# Patient Record
Sex: Female | Born: 1940 | Race: White | Hispanic: No | Smoking: Never smoker
Health system: Southern US, Community
[De-identification: ages and names within clinical notes are randomized; demographics above are authoritative.]

## PROBLEM LIST (undated history)

## (undated) DIAGNOSIS — D649 Anemia, unspecified: Secondary | ICD-10-CM

## (undated) DIAGNOSIS — I10 Essential (primary) hypertension: Secondary | ICD-10-CM

## (undated) DIAGNOSIS — E7800 Pure hypercholesterolemia, unspecified: Secondary | ICD-10-CM

## (undated) DIAGNOSIS — Z9889 Other specified postprocedural states: Secondary | ICD-10-CM

## (undated) DIAGNOSIS — R51 Headache: Secondary | ICD-10-CM

## (undated) DIAGNOSIS — R112 Nausea with vomiting, unspecified: Secondary | ICD-10-CM

## (undated) DIAGNOSIS — M1990 Unspecified osteoarthritis, unspecified site: Secondary | ICD-10-CM

## (undated) DIAGNOSIS — R519 Headache, unspecified: Secondary | ICD-10-CM

## (undated) DIAGNOSIS — D333 Benign neoplasm of cranial nerves: Secondary | ICD-10-CM

## (undated) HISTORY — PX: CATARACT EXTRACTION: SUR2

## (undated) HISTORY — PX: PLACEMENT OF BREAST IMPLANTS: SHX6334

## (undated) HISTORY — PX: ABDOMINAL HYSTERECTOMY: SHX81

---
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
## 2004-10-20 ENCOUNTER — Ambulatory Visit: Payer: Self-pay | Admitting: Gynecologic Oncology

## 2004-11-06 ENCOUNTER — Ambulatory Visit: Payer: Self-pay | Admitting: Gynecologic Oncology

## 2004-12-24 ENCOUNTER — Ambulatory Visit: Payer: Self-pay | Admitting: Unknown Physician Specialty

## 2004-12-24 ENCOUNTER — Other Ambulatory Visit: Payer: Self-pay

## 2004-12-29 ENCOUNTER — Ambulatory Visit: Payer: Self-pay | Admitting: Unknown Physician Specialty

## 2005-02-09 ENCOUNTER — Ambulatory Visit: Payer: Self-pay | Admitting: Oncology

## 2005-03-10 ENCOUNTER — Ambulatory Visit: Payer: Self-pay | Admitting: Internal Medicine

## 2006-04-03 ENCOUNTER — Ambulatory Visit: Payer: Self-pay | Admitting: Internal Medicine

## 2006-04-26 ENCOUNTER — Ambulatory Visit: Payer: Self-pay | Admitting: Gastroenterology

## 2007-07-27 ENCOUNTER — Ambulatory Visit: Payer: Self-pay | Admitting: Family Medicine

## 2008-07-29 ENCOUNTER — Ambulatory Visit: Payer: Self-pay | Admitting: Family Medicine

## 2009-08-24 ENCOUNTER — Ambulatory Visit: Payer: Self-pay | Admitting: Family Medicine

## 2010-08-11 ENCOUNTER — Ambulatory Visit: Payer: Self-pay | Admitting: General Practice

## 2010-08-13 HISTORY — PX: OTHER SURGICAL HISTORY: SHX169

## 2010-08-23 ENCOUNTER — Inpatient Hospital Stay: Payer: Self-pay | Admitting: General Practice

## 2010-09-27 ENCOUNTER — Ambulatory Visit: Payer: Self-pay | Admitting: Family Medicine

## 2010-10-12 ENCOUNTER — Ambulatory Visit: Payer: Self-pay | Admitting: Family Medicine

## 2010-11-02 ENCOUNTER — Ambulatory Visit: Payer: Self-pay | Admitting: Family Medicine

## 2011-02-14 ENCOUNTER — Ambulatory Visit: Payer: Self-pay | Admitting: Unknown Physician Specialty

## 2011-02-15 ENCOUNTER — Ambulatory Visit: Payer: Self-pay | Admitting: Unknown Physician Specialty

## 2011-06-22 ENCOUNTER — Ambulatory Visit: Payer: Self-pay | Admitting: Otolaryngology

## 2011-08-09 DIAGNOSIS — D333 Benign neoplasm of cranial nerves: Secondary | ICD-10-CM | POA: Diagnosis present

## 2012-04-17 ENCOUNTER — Ambulatory Visit: Payer: Self-pay | Admitting: Internal Medicine

## 2012-05-03 ENCOUNTER — Ambulatory Visit: Payer: Self-pay | Admitting: Orthopedic Surgery

## 2012-08-17 ENCOUNTER — Ambulatory Visit: Payer: Self-pay | Admitting: Internal Medicine

## 2012-08-17 LAB — COMPREHENSIVE METABOLIC PANEL
Albumin: 4 g/dL (ref 3.4–5.0)
Alkaline Phosphatase: 91 U/L (ref 50–136)
Anion Gap: 8 (ref 7–16)
BUN: 19 mg/dL — ABNORMAL HIGH (ref 7–18)
Chloride: 106 mmol/L (ref 98–107)
Co2: 27 mmol/L (ref 21–32)
Creatinine: 0.95 mg/dL (ref 0.60–1.30)
EGFR (African American): >60
Glucose: 95 mg/dL (ref 65–99)
Osmolality: 283 (ref 275–301)

## 2012-08-17 LAB — CBC WITH DIFFERENTIAL/PLATELET
Basophil %: 0.6 %
Eosinophil #: 0.3 10*3/uL (ref 0.0–0.7)
Eosinophil %: 7.2 %
HCT: 36.9 % (ref 35.0–47.0)
Lymphocyte #: 0.8 10*3/uL — ABNORMAL LOW (ref 1.0–3.6)
Lymphocyte %: 17.6 %
MCV: 96 fL (ref 80–100)
Neutrophil #: 2.8 10*3/uL (ref 1.4–6.5)
RDW: 15.2 % — ABNORMAL HIGH (ref 11.5–14.5)

## 2012-08-17 LAB — LIPID PANEL
Cholesterol: 216 mg/dL — ABNORMAL HIGH (ref 0–200)
HDL Cholesterol: 83 mg/dL — ABNORMAL HIGH (ref 40–60)

## 2012-08-17 LAB — FERRITIN: Ferritin (ARMC): 78 ng/mL (ref 8–388)

## 2012-09-12 ENCOUNTER — Ambulatory Visit: Payer: Self-pay | Admitting: Otolaryngology

## 2012-10-17 ENCOUNTER — Ambulatory Visit: Payer: Self-pay | Admitting: Internal Medicine

## 2012-10-18 ENCOUNTER — Ambulatory Visit: Payer: Self-pay | Admitting: Internal Medicine

## 2012-10-23 ENCOUNTER — Other Ambulatory Visit: Payer: Self-pay | Admitting: Internal Medicine

## 2012-10-23 DIAGNOSIS — N63 Unspecified lump in unspecified breast: Secondary | ICD-10-CM

## 2012-10-30 ENCOUNTER — Ambulatory Visit
Admission: RE | Admit: 2012-10-30 | Discharge: 2012-10-30 | Disposition: A | Discharge status: Home / Self Care | Payer: Medicare Other | Source: Ambulatory Visit | Attending: Internal Medicine | Admitting: Internal Medicine

## 2012-10-30 DIAGNOSIS — N63 Unspecified lump in unspecified breast: Secondary | ICD-10-CM

## 2012-10-30 MED ORDER — GADOBENATE DIMEGLUMINE 529 MG/ML IV SOLN
15.0000 mL | Freq: Once | INTRAVENOUS | Status: AC | PRN
  Administered 2012-10-30: 15 mL via INTRAVENOUS

## 2013-08-08 HISTORY — PX: FRACTURE SURGERY: SHX138

## 2013-09-03 ENCOUNTER — Ambulatory Visit: Payer: Self-pay | Admitting: Otolaryngology

## 2014-05-11 ENCOUNTER — Emergency Department: Payer: Self-pay | Admitting: Emergency Medicine

## 2014-05-17 ENCOUNTER — Emergency Department: Payer: Self-pay | Admitting: Internal Medicine

## 2014-10-01 ENCOUNTER — Ambulatory Visit: Payer: Self-pay | Admitting: Otolaryngology

## 2014-12-03 ENCOUNTER — Ambulatory Visit
Admit: 2014-12-03 | Disposition: A | Discharge status: Home / Self Care | Payer: Self-pay | Attending: Ophthalmology | Admitting: Ophthalmology

## 2015-11-27 ENCOUNTER — Other Ambulatory Visit: Payer: Self-pay | Admitting: Otolaryngology

## 2015-11-27 DIAGNOSIS — D333 Benign neoplasm of cranial nerves: Secondary | ICD-10-CM

## 2015-12-30 ENCOUNTER — Ambulatory Visit
Admission: RE | Admit: 2015-12-30 | Discharge: 2015-12-30 | Disposition: A | Discharge status: Home / Self Care | Payer: Medicare HMO | Source: Ambulatory Visit | Attending: Otolaryngology | Admitting: Otolaryngology

## 2015-12-30 DIAGNOSIS — I6782 Cerebral ischemia: Secondary | ICD-10-CM | POA: Diagnosis not present

## 2015-12-30 DIAGNOSIS — D333 Benign neoplasm of cranial nerves: Secondary | ICD-10-CM | POA: Diagnosis present

## 2015-12-30 LAB — POCT I-STAT CREATININE: Creatinine, Ser: 1.2 mg/dL — ABNORMAL HIGH (ref 0.44–1.00)

## 2015-12-30 MED ORDER — GADOBENATE DIMEGLUMINE 529 MG/ML IV SOLN
15.0000 mL | Freq: Once | INTRAVENOUS | Status: AC | PRN
  Administered 2015-12-30: 15 mL via INTRAVENOUS

## 2016-04-04 ENCOUNTER — Other Ambulatory Visit: Payer: Self-pay | Admitting: Family Medicine

## 2016-04-04 DIAGNOSIS — Z780 Asymptomatic menopausal state: Secondary | ICD-10-CM

## 2016-10-31 ENCOUNTER — Other Ambulatory Visit: Payer: Self-pay | Admitting: Orthopedic Surgery

## 2016-10-31 DIAGNOSIS — G8929 Other chronic pain: Secondary | ICD-10-CM

## 2016-10-31 DIAGNOSIS — M5441 Lumbago with sciatica, right side: Principal | ICD-10-CM

## 2016-11-09 ENCOUNTER — Ambulatory Visit
Admission: RE | Admit: 2016-11-09 | Discharge: 2016-11-09 | Disposition: A | Discharge status: Home / Self Care | Payer: Medicare HMO | Source: Ambulatory Visit | Attending: Orthopedic Surgery | Admitting: Orthopedic Surgery

## 2016-11-15 ENCOUNTER — Ambulatory Visit: Payer: Medicare HMO

## 2017-01-13 ENCOUNTER — Encounter
Admission: RE | Admit: 2017-01-13 | Discharge: 2017-01-13 | Disposition: A | Discharge status: Home / Self Care | Payer: Medicare HMO | Source: Ambulatory Visit | Attending: Orthopedic Surgery | Admitting: Orthopedic Surgery

## 2017-01-13 DIAGNOSIS — Z01818 Encounter for other preprocedural examination: Secondary | ICD-10-CM | POA: Insufficient documentation

## 2017-01-13 DIAGNOSIS — I10 Essential (primary) hypertension: Secondary | ICD-10-CM | POA: Insufficient documentation

## 2017-01-13 DIAGNOSIS — R9431 Abnormal electrocardiogram [ECG] [EKG]: Secondary | ICD-10-CM | POA: Diagnosis not present

## 2017-01-13 DIAGNOSIS — M1611 Unilateral primary osteoarthritis, right hip: Secondary | ICD-10-CM | POA: Insufficient documentation

## 2017-01-13 HISTORY — DX: Nausea with vomiting, unspecified: R11.2

## 2017-01-13 HISTORY — DX: Headache, unspecified: R51.9

## 2017-01-13 HISTORY — DX: Unspecified osteoarthritis, unspecified site: M19.90

## 2017-01-13 HISTORY — DX: Headache: R51

## 2017-01-13 HISTORY — DX: Essential (primary) hypertension: I10

## 2017-01-13 HISTORY — DX: Anemia, unspecified: D64.9

## 2017-01-13 HISTORY — DX: Pure hypercholesterolemia, unspecified: E78.00

## 2017-01-13 HISTORY — DX: Other specified postprocedural states: Z98.890

## 2017-01-13 LAB — TYPE AND SCREEN
ABO/RH(D): O POS
Antibody Screen: NEGATIVE

## 2017-01-13 LAB — COMPREHENSIVE METABOLIC PANEL
ALK PHOS: 68 U/L (ref 38–126)
ALT: 18 U/L (ref 14–54)
AST: 28 U/L (ref 15–41)
Albumin: 4.2 g/dL (ref 3.5–5.0)
Anion gap: 10 (ref 5–15)
BILIRUBIN TOTAL: 0.8 mg/dL (ref 0.3–1.2)
BUN: 23 mg/dL — AB (ref 6–20)
CALCIUM: 9.6 mg/dL (ref 8.9–10.3)
CO2: 24 mmol/L (ref 22–32)
CREATININE: 1.14 mg/dL — AB (ref 0.44–1.00)
Chloride: 101 mmol/L (ref 101–111)
GFR, EST AFRICAN AMERICAN: 52 mL/min — AB (ref >60)
GFR, EST NON AFRICAN AMERICAN: 45 mL/min — AB (ref >60)
Glucose, Bld: 97 mg/dL (ref 65–99)
Potassium: 4 mmol/L (ref 3.5–5.1)
Sodium: 135 mmol/L (ref 135–145)
TOTAL PROTEIN: 7.2 g/dL (ref 6.5–8.1)

## 2017-01-13 LAB — URINALYSIS, ROUTINE W REFLEX MICROSCOPIC
BILIRUBIN URINE: NEGATIVE
GLUCOSE, UA: NEGATIVE mg/dL
HGB URINE DIPSTICK: NEGATIVE
Ketones, ur: NEGATIVE mg/dL
NITRITE: NEGATIVE
PROTEIN: NEGATIVE mg/dL
Specific Gravity, Urine: 1.009 (ref 1.005–1.030)
pH: 5 (ref 5.0–8.0)

## 2017-01-13 LAB — PROTIME-INR
INR: 0.99
PROTHROMBIN TIME: 13.1 s (ref 11.4–15.2)

## 2017-01-13 LAB — SEDIMENTATION RATE: SED RATE: 35 mm/h — AB (ref 0–30)

## 2017-01-13 LAB — CBC
HEMATOCRIT: 36.1 % (ref 35.0–47.0)
Hemoglobin: 12.2 g/dL (ref 12.0–16.0)
MCH: 33.2 pg (ref 26.0–34.0)
MCHC: 33.7 g/dL (ref 32.0–36.0)
MCV: 98.5 fL (ref 80.0–100.0)
PLATELETS: 220 10*3/uL (ref 150–440)
RBC: 3.67 MIL/uL — AB (ref 3.80–5.20)
RDW: 14.8 % — ABNORMAL HIGH (ref 11.5–14.5)
WBC: 5.2 10*3/uL (ref 3.6–11.0)

## 2017-01-13 LAB — SURGICAL PCR SCREEN
MRSA, PCR: NEGATIVE
Staphylococcus aureus: NEGATIVE

## 2017-01-13 LAB — APTT: aPTT: 28 seconds (ref 24–36)

## 2017-01-13 LAB — C-REACTIVE PROTEIN: CRP: <0.8 mg/dL (ref <1.0)

## 2017-01-13 NOTE — Patient Instructions (Addendum)
  Your procedure is scheduled BB:[id number] January 27, 2017. Report to Same Day Surgery. To find out your arrival time please call [phone] between 1PM - 3PM on Thursday January 26, 2017.  Remember: Instructions that are not followed completely may result in serious medical risk, up to and including death, or upon the discretion of your surgeon and anesthesiologist your surgery may need to be rescheduled.    _x___ 1. Do not eat food or drink liquids after midnight. No gum chewing or hard candies.     _x___ 2. No Alcohol for 24 hours before or after surgery.   ____ 3. Bring all medications with you on the day of surgery if instructed.    __x__ 4. Notify your doctor if there is any change in your medical condition     (cold, fever, infections).    _____ 5. No smoking 24 hours prior to surgery.     Do not wear jewelry, make-up, hairpins, clips or nail polish.  Do not wear lotions, powders, or perfumes.   Do not shave 48 hours prior to surgery. Men may shave face and neck.  Do not bring valuables to the hospital.    [facility] is not responsible for any belongings or valuables.               Contacts, dentures or bridgework may not be worn into surgery.  Leave your suitcase in the car. After surgery it may be brought to your room.  For patients admitted to the hospital, discharge time is determined by your treatment team.   Patients discharged the day of surgery will not be allowed to drive home.    Please read over the following fact sheets that you were given:   [facility] Preparing for Surgery  __x__ Take these medicines the morning of surgery with A SIP OF WATER:    1. Omperazole/ Prilosec   ____ [employer] (as directed)   _x___ Use CHG Soap as directed on instruction sheet  ____ Use inhalers on the day of surgery and bring to hospital day of surgery  ____ Stop metformin 2 days prior to surgery    ____ Take 1/2 of usual insulin dose the night before surgery and none on  the morning of surgery.   __x__ Stop aspirin on 5 days prior to surgery.  _x___ Stop Anti-inflammatories such as Advil, Aleve, Ibuprofen, Motrin, Naproxen, Naprosyn, Goodies powders or aspirin products. OK to take Tylenol.   _x___ Stop supplements: Physio Omega and Anti-aging total body defense until after surgery.    ____ Bring C-Pap to the hospital.

## 2017-01-16 LAB — URINE CULTURE: Special Requests: NORMAL

## 2017-01-16 NOTE — Pre-Procedure Instructions (Signed)
UA and urine culture sent to Dr. Marry Guan for review.

## 2017-01-23 ENCOUNTER — Inpatient Hospital Stay
Admission: EM | Admit: 2017-01-23 | Discharge: 2017-01-29 | DRG: 470 | Disposition: A | Discharge status: Home Health | Payer: Medicare HMO | Attending: Orthopedic Surgery | Admitting: Orthopedic Surgery

## 2017-01-23 ENCOUNTER — Encounter: Payer: Self-pay | Admitting: *Deleted

## 2017-01-23 ENCOUNTER — Emergency Department: Payer: Medicare HMO

## 2017-01-23 ENCOUNTER — Inpatient Hospital Stay: Payer: Medicare HMO

## 2017-01-23 DIAGNOSIS — M79659 Pain in unspecified thigh: Secondary | ICD-10-CM

## 2017-01-23 DIAGNOSIS — M48061 Spinal stenosis, lumbar region without neurogenic claudication: Secondary | ICD-10-CM | POA: Diagnosis present

## 2017-01-23 DIAGNOSIS — Z96649 Presence of unspecified artificial hip joint: Secondary | ICD-10-CM

## 2017-01-23 DIAGNOSIS — Z9849 Cataract extraction status, unspecified eye: Secondary | ICD-10-CM

## 2017-01-23 DIAGNOSIS — M792 Neuralgia and neuritis, unspecified: Secondary | ICD-10-CM

## 2017-01-23 DIAGNOSIS — E7800 Pure hypercholesterolemia, unspecified: Secondary | ICD-10-CM | POA: Diagnosis present

## 2017-01-23 DIAGNOSIS — Z9882 Breast implant status: Secondary | ICD-10-CM

## 2017-01-23 DIAGNOSIS — I739 Peripheral vascular disease, unspecified: Secondary | ICD-10-CM | POA: Diagnosis present

## 2017-01-23 DIAGNOSIS — M1611 Unilateral primary osteoarthritis, right hip: Principal | ICD-10-CM | POA: Diagnosis present

## 2017-01-23 DIAGNOSIS — M25551 Pain in right hip: Secondary | ICD-10-CM | POA: Diagnosis not present

## 2017-01-23 DIAGNOSIS — E785 Hyperlipidemia, unspecified: Secondary | ICD-10-CM | POA: Diagnosis present

## 2017-01-23 DIAGNOSIS — M879 Osteonecrosis, unspecified: Secondary | ICD-10-CM | POA: Diagnosis present

## 2017-01-23 DIAGNOSIS — K5900 Constipation, unspecified: Secondary | ICD-10-CM | POA: Diagnosis present

## 2017-01-23 DIAGNOSIS — M4726 Other spondylosis with radiculopathy, lumbar region: Secondary | ICD-10-CM | POA: Diagnosis present

## 2017-01-23 DIAGNOSIS — Z79899 Other long term (current) drug therapy: Secondary | ICD-10-CM

## 2017-01-23 DIAGNOSIS — M5410 Radiculopathy, site unspecified: Secondary | ICD-10-CM

## 2017-01-23 DIAGNOSIS — M5116 Intervertebral disc disorders with radiculopathy, lumbar region: Secondary | ICD-10-CM | POA: Diagnosis present

## 2017-01-23 DIAGNOSIS — K219 Gastro-esophageal reflux disease without esophagitis: Secondary | ICD-10-CM | POA: Diagnosis present

## 2017-01-23 DIAGNOSIS — I10 Essential (primary) hypertension: Secondary | ICD-10-CM | POA: Diagnosis present

## 2017-01-23 DIAGNOSIS — Z96651 Presence of right artificial knee joint: Secondary | ICD-10-CM | POA: Diagnosis present

## 2017-01-23 DIAGNOSIS — G8929 Other chronic pain: Secondary | ICD-10-CM | POA: Diagnosis present

## 2017-01-23 DIAGNOSIS — M79604 Pain in right leg: Secondary | ICD-10-CM

## 2017-01-23 DIAGNOSIS — R531 Weakness: Secondary | ICD-10-CM | POA: Diagnosis present

## 2017-01-23 DIAGNOSIS — Z90710 Acquired absence of both cervix and uterus: Secondary | ICD-10-CM

## 2017-01-23 DIAGNOSIS — D333 Benign neoplasm of cranial nerves: Secondary | ICD-10-CM | POA: Diagnosis present

## 2017-01-23 DIAGNOSIS — Z7982 Long term (current) use of aspirin: Secondary | ICD-10-CM

## 2017-01-23 HISTORY — DX: Benign neoplasm of cranial nerves: D33.3

## 2017-01-23 LAB — CBC
HCT: 36.9 % (ref 35.0–47.0)
HEMOGLOBIN: 12.7 g/dL (ref 12.0–16.0)
MCH: 33.6 pg (ref 26.0–34.0)
MCHC: 34.5 g/dL (ref 32.0–36.0)
MCV: 97.4 fL (ref 80.0–100.0)
PLATELETS: 223 10*3/uL (ref 150–440)
RBC: 3.79 MIL/uL — ABNORMAL LOW (ref 3.80–5.20)
RDW: 14.8 % — AB (ref 11.5–14.5)
WBC: 10.3 10*3/uL (ref 3.6–11.0)

## 2017-01-23 LAB — BASIC METABOLIC PANEL
Anion gap: 8 (ref 5–15)
BUN: 16 mg/dL (ref 6–20)
CALCIUM: 9.6 mg/dL (ref 8.9–10.3)
CHLORIDE: 104 mmol/L (ref 101–111)
CO2: 23 mmol/L (ref 22–32)
CREATININE: 1.02 mg/dL — AB (ref 0.44–1.00)
GFR calc non Af Amer: 52 mL/min — ABNORMAL LOW (ref >60)
GFR, EST AFRICAN AMERICAN: 60 mL/min — AB (ref >60)
Glucose, Bld: 116 mg/dL — ABNORMAL HIGH (ref 65–99)
Potassium: 3.5 mmol/L (ref 3.5–5.1)
SODIUM: 135 mmol/L (ref 135–145)

## 2017-01-23 MED ORDER — ACETAMINOPHEN ER 650 MG PO TBCR: 1300.0000 mg | EXTENDED_RELEASE_TABLET | Freq: Three times a day (TID) | ORAL | Status: DC

## 2017-01-23 MED ORDER — FENTANYL CITRATE (PF) 100 MCG/2ML IJ SOLN
50.0000 ug | Freq: Once | INTRAMUSCULAR | Status: AC
  Administered 2017-01-23: 50 ug via INTRAVENOUS
  Filled 2017-01-23: qty 2

## 2017-01-23 MED ORDER — OXYCODONE HCL 5 MG PO TABS: 10.0000 mg | ORAL_TABLET | ORAL | Status: DC | PRN

## 2017-01-23 MED ORDER — CHLORHEXIDINE GLUCONATE 4 % EX LIQD: 60.0000 mL | Freq: Once | CUTANEOUS | Status: DC

## 2017-01-23 MED ORDER — ADULT MULTIVITAMIN W/MINERALS CH
1.0000 | ORAL_TABLET | Freq: Every day | ORAL | Status: DC
  Administered 2017-01-24 – 2017-01-29 (×5): 1 via ORAL
  Filled 2017-01-23 (×5): qty 1

## 2017-01-23 MED ORDER — CEFAZOLIN SODIUM-DEXTROSE 2-4 GM/100ML-% IV SOLN
2.0000 g | INTRAVENOUS | Status: DC
  Filled 2017-01-23: qty 100

## 2017-01-23 MED ORDER — CALCIUM CARBONATE-VITAMIN D 500-200 MG-UNIT PO TABS
1.0000 | ORAL_TABLET | Freq: Every day | ORAL | Status: DC
  Administered 2017-01-24 – 2017-01-29 (×5): 1 via ORAL
  Filled 2017-01-23 (×5): qty 1

## 2017-01-23 MED ORDER — DOCUSATE SODIUM 100 MG PO CAPS
100.0000 mg | ORAL_CAPSULE | Freq: Two times a day (BID) | ORAL | Status: DC
  Administered 2017-01-23 – 2017-01-26 (×7): 100 mg via ORAL
  Filled 2017-01-23 (×7): qty 1

## 2017-01-23 MED ORDER — HYDROCODONE-ACETAMINOPHEN 5-325 MG PO TABS
1.0000 | ORAL_TABLET | ORAL | Status: DC | PRN
  Administered 2017-01-24 (×2): 1 via ORAL
  Administered 2017-01-24: 2 via ORAL
  Administered 2017-01-25 – 2017-01-26 (×6): 1 via ORAL
  Administered 2017-01-26 – 2017-01-27 (×3): 2 via ORAL
  Filled 2017-01-23 (×3): qty 1
  Filled 2017-01-23: qty 2
  Filled 2017-01-23 (×5): qty 1
  Filled 2017-01-23: qty 2
  Filled 2017-01-23: qty 1
  Filled 2017-01-23: qty 2
  Filled 2017-01-23: qty 1

## 2017-01-23 MED ORDER — LACTATED RINGERS IV SOLN: INTRAVENOUS | Status: DC

## 2017-01-23 MED ORDER — ROSUVASTATIN CALCIUM 10 MG PO TABS
40.0000 mg | ORAL_TABLET | Freq: Every day | ORAL | Status: DC
  Administered 2017-01-23 – 2017-01-26 (×4): 40 mg via ORAL
  Filled 2017-01-23: qty 4
  Filled 2017-01-23: qty 2
  Filled 2017-01-23: qty 4
  Filled 2017-01-23 (×2): qty 2
  Filled 2017-01-23: qty 4

## 2017-01-23 MED ORDER — METOPROLOL SUCCINATE ER 50 MG PO TB24
50.0000 mg | ORAL_TABLET | Freq: Every day | ORAL | Status: DC
  Administered 2017-01-23 – 2017-01-28 (×6): 50 mg via ORAL
  Filled 2017-01-23 (×6): qty 1

## 2017-01-23 MED ORDER — ONDANSETRON HCL 4 MG/2ML IJ SOLN
4.0000 mg | Freq: Four times a day (QID) | INTRAMUSCULAR | Status: DC | PRN
  Administered 2017-01-27: 4 mg via INTRAVENOUS

## 2017-01-23 MED ORDER — ACETAMINOPHEN 650 MG RE SUPP: 650.0000 mg | Freq: Four times a day (QID) | RECTAL | Status: DC | PRN

## 2017-01-23 MED ORDER — ACETAMINOPHEN 325 MG PO TABS: 650.0000 mg | ORAL_TABLET | Freq: Four times a day (QID) | ORAL | Status: DC | PRN

## 2017-01-23 MED ORDER — SENNOSIDES-DOCUSATE SODIUM 8.6-50 MG PO TABS: 1.0000 | ORAL_TABLET | Freq: Every evening | ORAL | Status: DC | PRN

## 2017-01-23 MED ORDER — MORPHINE SULFATE (PF) 2 MG/ML IV SOLN
2.0000 mg | INTRAVENOUS | Status: DC | PRN
  Administered 2017-01-23 – 2017-01-25 (×2): 2 mg via INTRAVENOUS
  Filled 2017-01-23 (×2): qty 1

## 2017-01-23 MED ORDER — PANTOPRAZOLE SODIUM 40 MG PO TBEC
40.0000 mg | DELAYED_RELEASE_TABLET | Freq: Every day | ORAL | Status: DC
Start: 2017-01-24 — End: 2017-01-27
  Administered 2017-01-24 – 2017-01-26 (×3): 40 mg via ORAL
  Filled 2017-01-23 (×3): qty 1

## 2017-01-23 MED ORDER — ONDANSETRON HCL 4 MG PO TABS
4.0000 mg | ORAL_TABLET | Freq: Four times a day (QID) | ORAL | Status: DC | PRN
  Administered 2017-01-23: 4 mg via ORAL
  Filled 2017-01-23: qty 1

## 2017-01-23 MED ORDER — TRANEXAMIC ACID 1000 MG/10ML IV SOLN
1000.0000 mg | INTRAVENOUS | Status: DC
  Filled 2017-01-23: qty 10

## 2017-01-23 MED ORDER — RISAQUAD PO CAPS
1.0000 | ORAL_CAPSULE | Freq: Every day | ORAL | Status: DC
  Administered 2017-01-24 – 2017-01-29 (×5): 1 via ORAL
  Filled 2017-01-23 (×6): qty 1

## 2017-01-23 MED ORDER — ONDANSETRON HCL 4 MG/2ML IJ SOLN
4.0000 mg | Freq: Once | INTRAMUSCULAR | Status: AC
  Administered 2017-01-23: 4 mg via INTRAVENOUS
  Filled 2017-01-23: qty 2

## 2017-01-23 MED ORDER — AZO CRANBERRY 250-30 MG PO TABS: ORAL_TABLET | Freq: Every day | ORAL | Status: DC | PRN

## 2017-01-23 NOTE — ED Notes (Signed)
Pt to xray at this time.

## 2017-01-23 NOTE — Consult Note (Signed)
Orthopaedics:  Patient seen and evaluated. Studies reviewed. Will order lumbar MRI due to the radicular symptoms.  Full note to follow.  James P. Holley Bouche M.D.

## 2017-01-23 NOTE — ED Notes (Signed)
Patient wanting to know about her bed and when she is going to get to her room. Explained to patient this can sometimes take an hour or so.

## 2017-01-23 NOTE — ED Provider Notes (Signed)
Clinical Course as of Jan 23 1933  Mon Jan 23, 2017  1517 Assuming care from Dr. Kerman Passey.  In short, Aimee Oliver is a 76 y.o. female with a chief complaint of hip pain and inability to ambulate.  Refer to the original H&P for additional details.  The current plan of care is to obtain MRI of hip as per Dr. Clydell Hakim request and then update him by phone.  Anticipate admission due to intractable pain and inability to ambulate.   [CF]  1752 Updated Dr. Marry Guan about MRI results, but he is in the OR and I could not speak with him directly.  Messages were relayed between [place] and the two of [place].  He will call me back after he is finished because I have multiple critical patients in the ED at this time.  [CF]    Clinical Course User Index [CF] Hinda Kehr, MD   7:35 PM:  Spoke by phone with Dr. Marry Guan a few minutes ago.  He is out of the operating room.  I updated him about the fact that the patient is now admitted and gave him the room number.  He will go now to see the patient in person and order any additional studies that may be needed.   Hinda Kehr, MD 01/23/17 Aimee Oliver

## 2017-01-23 NOTE — ED Provider Notes (Signed)
[facility] Emergency Department Provider Note  Time seen: 2:10 PM  I have reviewed the triage vital signs and the nursing notes.   HISTORY  Chief Complaint Hip Pain    HPI Aimee Oliver is a 76 y.o. female with a past medical history of anemia, hypertension, hyperlipidemia, presents to the emergency department with right hip pain. According to the patient she has a history of arthritis, is scheduled to have a right hip replacement performed on Friday by Dr. Bess Harvest. However the patient states since yesterday she has had significant increase in pain in the right hip. States she is having significant pain with any attempted ambulation. States she cannot sit for longer and 20-30 minutes before the pain becomes too severe. Patient is only prescribed Tylenol at home for discomfort. States she called Dr. Scarlett Presto office and they referred her to the emergency department for evaluation. Patient denies any other complaints at this time. Denies any falls or trauma.  Past Medical History:  Diagnosis Date  . Anemia    boarderline anemic  . Arthritis   . Headache    sinus head aches  . Hypercholesterolemia   . Hypertension   . PONV (postoperative nausea and vomiting)    with hysterectomy    There are no active problems to display for this patient.   Past Surgical History:  Procedure Laterality Date  . ABDOMINAL HYSTERECTOMY    . FRACTURE SURGERY Left 2015   wrist  . FRACTURE SURGERY Left 1956   ankle  . JOINT REPLACEMENT Right 2012    Prior to Admission medications   Medication Sig Start Date End Date Taking? Authorizing Provider  acetaminophen (TYLENOL) 650 MG CR tablet Take 1,300 mg by mouth 3 (three) times daily.    [provider]  Ascorbic Acid (VITAMIN C PO) Take 1 tablet by mouth daily as needed (for immune system booster).    [provider]  aspirin EC 81 MG tablet Take 81 mg by mouth daily.    [provider]  Calcium  Carbonate-Vit D-Min (CALTRATE 600+D PLUS PO) Take 1 tablet by mouth daily.    [provider]  Cranberry-Vitamin C-Probiotic (AZO CRANBERRY PO) Take 1 tablet by mouth daily as needed (for UTI prevention).    [provider]  Lactobacillus (ACIDOPHILUS PO) Take 1 capsule by mouth daily.    [provider]  metoprolol succinate (TOPROL-XL) 50 MG 24 hr tablet Take 50 mg by mouth at bedtime. 12/06/16   [provider]  Multiple Vitamin (MULTIVITAMIN WITH MINERALS) TABS tablet Take 1 tablet by mouth daily. One-A-Day Women's 50+    [provider]  omeprazole (PRILOSEC) 20 MG capsule Take 20 mg by mouth daily before breakfast. 12/06/16   [provider]  OVER THE COUNTER MEDICATION Take 1 tablet by mouth 2 (two) times daily. PHYSIO OMEGA (a blend of DPA, EPA, & DHA FATTY ACIDS)    [provider]  OVER THE COUNTER MEDICATION 1 tablet at bedtime. Applied nutrition Anti-Aging total body daily defense, Co Q10, Vitamin D3, Resveratrol, ginkgo, green tea, lutein and Omega-3 oils    [provider]  phenylephrine (SUDAFED PE) 10 MG TABS tablet Take 10 mg by mouth every 4 (four) hours as needed.    [provider]  rosuvastatin (CRESTOR) 40 MG tablet Take 40 mg by mouth at bedtime.    [provider]    No Known Allergies  History reviewed. No pertinent family history.  Social History Social  History  Substance Use Topics  . Smoking status: Never Smoker  . Smokeless tobacco: Never Used  . Alcohol use Not on file     Comment: 1 cocktail every night    Review of Systems Constitutional: Negative for fever Cardiovascular: Negative for chest pain. Respiratory: Negative for shortness of breath. Gastrointestinal: Negative for abdominal pain Musculoskeletal: Right hip pain Neurological: Negative for headache All other ROS negative  ____________________________________________   PHYSICAL EXAM:  VITAL SIGNS: ED  Triage Vitals  Enc Vitals Group     BP 01/23/17 1358 (!) 189/89     Pulse Rate 01/23/17 1358 (!) 114     Resp 01/23/17 1358 18     Temp 01/23/17 1358 98.7 F (37.1 C)     Temp Source 01/23/17 1358 Oral     SpO2 01/23/17 1358 97 %     Weight 01/23/17 1358 166 lb (75.3 kg)     Height 01/23/17 1358 5\' 5"  (1.651 m)     Head Circumference --      Peak Flow --      Pain Score 01/23/17 1357 10     Pain Loc --      Pain Edu? --      Excl. in [place]? --     Constitutional: Alert and oriented. Well appearing and in no distress. Eyes: Normal exam ENT   Head: Normocephalic and atraumatic.   Mouth/Throat: Mucous membranes are moist. Cardiovascular: Normal rate, regular rhythm. No murmur Respiratory: Normal respiratory effort without tachypnea nor retractions. Breath sounds are clear  Gastrointestinal: Soft and nontender. No distention Musculoskeletal: Patient with mild tenderness palpation of the right hip, significant pain with attempted range of motion of the right hip. Neurovascularly intact distally. Sensation intact and equal. Neurologic:  Normal speech and language. No gross focal neurologic deficits  Skin:  Skin is warm, dry and intact.  Psychiatric: Mood and affect are normal.     RADIOLOGY  IMPRESSION: 1. Degenerative changes lumbar spine and both hips.  2. Sclerotic changes both femoral heads. Avascular necrosis cannot be excluded.  3. Peripheral vascular disease .  ____________________________________________   INITIAL IMPRESSION / ASSESSMENT AND PLAN / ED COURSE  Pertinent labs & imaging results that were available during my care of the patient were reviewed by me and considered in my medical decision making (see chart for details).  Patient presents to the emergency department with right hip pain worse since yesterday. Patient states she has chronic right hip pain but it acutely became worse yesterday patient is not able to ambulate more than 1 or 2 steps due to  the discomfort. We'll obtain x-rays to evaluate. Patient does have significant pain with attempted range of motion. X-rays are negative we will likely proceed with a CT scan given the patient's significant discomfort and acute worsening of pain.  Patient continues with significant pain with attempted range of motion of the right hip. X-ray shows degenerative changes. I discussed the patient with Dr. Bess Harvest who recommends obtaining an MRI. Patient care signed out to Dr. Karma Greaser, MRI pending.  ____________________________________________   FINAL CLINICAL IMPRESSION(S) / ED DIAGNOSES  Right hip pain    Harvest Dark, MD 01/23/17 1538

## 2017-01-23 NOTE — Progress Notes (Signed)
PHARMACIST - PHYSICIAN ORDER COMMUNICATION  CONCERNING: P&T Medication Policy on Herbal Medications  DESCRIPTION:  This patient's order for:  Azo - cranberry  has been noted.  This product(s) is classified as an "herbal" or natural product. Due to a lack of definitive safety studies or FDA approval, nonstandard manufacturing practices, plus the potential risk of unknown drug-drug interactions while on inpatient medications, the Pharmacy and Therapeutics Committee does not permit the use of "herbal" or natural products of this type within [facility].   ACTION TAKEN: The pharmacy department is unable to verify this order at this time and your patient has been informed of this safety policy. Please reevaluate patient's clinical condition at discharge and address if the herbal or natural product(s) should be resumed at that time.

## 2017-01-23 NOTE — H&P (Signed)
Aimee Oliver at [place] NAME: Aimee Oliver    MR#:  [id number]  DATE OF BIRTH:  [birth year]  DATE OF ADMISSION:  01/23/2017  PRIMARY CARE PHYSICIAN: Hortencia Pilar, MD   REQUESTING/REFERRING PHYSICIAN:   CHIEF COMPLAINT:  Rt hip pain  HISTORY OF PRESENT ILLNESS:  Aimee Oliver  is a 76 y.o. female with a known history of Hypertension, hyperlipidemia, chronic arthritis is presenting to the ED with a chief complaint of intractable right hip pain which is chronically getting worse. Patient actually scheduled to get right hip replacement performed on Friday by Dr. Marry Guan. ED physician has discussed with Dr. Marry Guan who has recommended MRI and he might consider  preponing the surgery  PAST MEDICAL HISTORY:   Past Medical History:  Diagnosis Date  . Anemia    boarderline anemic  . Arthritis   . Headache    sinus head aches  . Hypercholesterolemia   . Hypertension   . PONV (postoperative nausea and vomiting)    with hysterectomy    PAST SURGICAL HISTOIRY:   Past Surgical History:  Procedure Laterality Date  . ABDOMINAL HYSTERECTOMY    . FRACTURE SURGERY Left 2015   wrist  . FRACTURE SURGERY Left 1956   ankle  . JOINT REPLACEMENT Right 2012    SOCIAL HISTORY:   Social History  Substance Use Topics  . Smoking status: Never Smoker  . Smokeless tobacco: Never Used  . Alcohol use Not on file     Comment: 1 cocktail every night    FAMILY HISTORY:  History reviewed. No pertinent family history.  DRUG ALLERGIES:  No Known Allergies  REVIEW OF SYSTEMS:  CONSTITUTIONAL: No fever, fatigue or weakness.  EYES: No blurred or double vision.  EARS, NOSE, AND THROAT: No tinnitus or ear pain.  RESPIRATORY: No cough, shortness of breath, wheezing or hemoptysis.  CARDIOVASCULAR: No chest pain, orthopnea, edema.  GASTROINTESTINAL: No nausea, vomiting, diarrhea or abdominal pain.  GENITOURINARY: No dysuria, hematuria.  ENDOCRINE: No  polyuria, nocturia,  HEMATOLOGY: No anemia, easy bruising or bleeding SKIN: No rash or lesion. MUSCULOSKELETAL:Reporting severe right hip pain and unable to ambulate. History of arthritis chronic NEUROLOGIC: No tingling, numbness, weakness.  PSYCHIATRY: No anxiety or depression.   MEDICATIONS AT HOME:   Prior to Admission medications   Medication Sig Start Date End Date Taking? Authorizing Provider  acetaminophen (TYLENOL) 650 MG CR tablet Take 1,300 mg by mouth 3 (three) times daily.   Yes [provider]  Ascorbic Acid (VITAMIN C PO) Take 1 tablet by mouth daily as needed (for immune system booster).   Yes [provider]  aspirin EC 81 MG tablet Take 81 mg by mouth daily.   Yes [provider]  Calcium Carbonate-Vit D-Min (CALTRATE 600+D PLUS PO) Take 1 tablet by mouth daily.   Yes [provider]  Cranberry-Vitamin C-Probiotic (AZO CRANBERRY PO) Take 1 tablet by mouth daily as needed (for UTI prevention).   Yes [provider]  Lactobacillus (ACIDOPHILUS PO) Take 1 capsule by mouth daily.   Yes [provider]  metoprolol succinate (TOPROL-XL) 50 MG 24 hr tablet Take 50 mg by mouth at bedtime. 12/06/16  Yes [provider]  Multiple Vitamin (MULTIVITAMIN WITH MINERALS) TABS tablet Take 1 tablet by mouth daily. One-A-Day Women's 50+   Yes [provider]  omeprazole (PRILOSEC) 20 MG capsule Take 20 mg by mouth daily before breakfast. 12/06/16  Yes [provider]  OVER THE  COUNTER MEDICATION Take 1 tablet by mouth 2 (two) times daily. PHYSIO OMEGA (a blend of DPA, EPA, & DHA FATTY ACIDS)   Yes [provider]  OVER THE COUNTER MEDICATION 1 tablet at bedtime. Applied nutrition Anti-Aging total body daily defense, Co Q10, Vitamin D3, Resveratrol, ginkgo, green tea, lutein and Omega-3 oils   Yes [provider]  phenylephrine (SUDAFED PE) 10 MG TABS tablet Take 10 mg by mouth every 4 (four) hours as  needed.   Yes [provider]  rosuvastatin (CRESTOR) 40 MG tablet Take 40 mg by mouth at bedtime.   Yes [provider]      VITAL SIGNS:  Blood pressure (!) 189/89, pulse (!) 114, temperature 98.7 F (37.1 C), temperature source Oral, resp. rate 18, height 5\' 5"  (1.651 m), weight 75.3 kg (166 lb), SpO2 97 %.  PHYSICAL EXAMINATION:  GENERAL:  76 y.o.-year-old patient lying in the bed with no acute distress.  EYES: Pupils equal, round, reactive to light and accommodation. No scleral icterus. Extraocular muscles intact.  HEENT: Head atraumatic, normocephalic. Oropharynx and nasopharynx clear.  NECK:  Supple, no jugular venous distention. No thyroid enlargement, no tenderness.  LUNGS: Normal breath sounds bilaterally, no wheezing, rales,rhonchi or crepitation. No use of accessory muscles of respiration.  CARDIOVASCULAR: S1, S2 normal. No murmurs, rubs, or gallops.  ABDOMEN: Soft, nontender, nondistended. Bowel sounds present. No organomegaly or mass.  EXTREMITIES: Right hip is tender , decreased range of motion No pedal edema, cyanosis, or clubbing.  NEUROLOGIC: Cranial nerves II through XII are intact. Muscle strength 5/5 in all extremities. Sensation intact. Gait not checked.  PSYCHIATRIC: The patient is alert and oriented x 3.  SKIN: No obvious rash, lesion, or ulcer.   LABORATORY PANEL:   CBC  Recent Labs Lab 01/23/17 1405  WBC 10.3  HGB 12.7  HCT 36.9  PLT 223   ------------------------------------------------------------------------------------------------------------------  Chemistries   Recent Labs Lab 01/23/17 1405  NA 135  K 3.5  CL 104  CO2 23  GLUCOSE 116*  BUN 16  CREATININE 1.02*  CALCIUM 9.6   ------------------------------------------------------------------------------------------------------------------  Cardiac Enzymes No results for input(s): TROPONINI in the last 168  hours. ------------------------------------------------------------------------------------------------------------------  RADIOLOGY:  Mr Hip Right Wo Contrast  Result Date: 01/23/2017 CLINICAL DATA:  76 year old with history of arthritis, scheduled for right hip arthroplasty 01/27/2017. Increasing right hip pain with ambulation over the last day. No acute injury. No leukocytosis or reported fever. EXAM: MR OF THE RIGHT HIP WITHOUT CONTRAST TECHNIQUE: Multiplanar, multisequence MR imaging was performed. No intravenous contrast was administered. COMPARISON:  Radiographs 01/23/2017.  No previous studies available. FINDINGS: Bones: In correlation with today's radiographs, there is asymmetric arthropathy involving the right hip joint. There is axial migration of the right femoral head with subchondral cyst formation and edema in the femoral head, neck and acetabulum. There is mild subchondral cyst formation posteriorly in the left femoral head. No specific evidence of avascular necrosis. The visualized sacroiliac joints and symphysis pubis appear normal. Lower lumbar spondylosis noted. Articular cartilage and labrum Articular cartilage: As above, asymmetric right hip arthropathy with axial migration, diffuse chondral thinning, subchondral cyst formation and edema. Labrum: There is no gross labral tear or paralabral abnormality. Joint or bursal effusion Joint effusion: Moderate size right hip joint effusion appears mildly complex with periarticular soft tissue edema. There is a small amount of hip joint fluid on the left. Bursae: No focal bursal fluid collections. Muscles and tendons Muscles and tendons: There is some T2 hyperintensity within  the right hip adductor musculature. The piriformis musculature appears normal. The gluteus, iliopsoas and hamstring tendons appear unremarkable. Other findings Miscellaneous: Sigmoid colon diverticular changes are noted. Evidence of previous hysterectomy. The visualized  internal pelvic contents otherwise appear unremarkable. IMPRESSION: 1. Asymmetric right hip arthropathy with asymmetric complex right hip joint effusion and periarticular edema. 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 2. No evidence of acute fracture, dislocation or avascular necrosis. 3. Mild left hip arthropathy. 4. Lower lumbar spondylosis. Electronically Signed   By: Richardean Sale M.D.   On: 01/23/2017 17:03   Dg Hip Unilat W Or Wo Pelvis 2-3 Views Right  Result Date: 01/23/2017 CLINICAL DATA:  Chronic right hip pain. EXAM: DG HIP (WITH OR WITHOUT PELVIS) 2-3V RIGHT COMPARISON:  No prior. FINDINGS: Degenerative changes in the lumbar spine and both hips. Sclerotic changes both femoral heads. Avascular necrosis cannot be excluded. Pelvic calcifications noted consistent phleboliths. Peripheral vascular calcification. IMPRESSION: 1. Degenerative changes lumbar spine and both hips. 2. Sclerotic changes both femoral heads. Avascular necrosis cannot be excluded. 3. Peripheral vascular disease . Electronically Signed   By: Marcello Moores  Register   On: 01/23/2017 15:07    EKG:   Orders placed or performed during the hospital encounter of 01/13/17  . EKG 12-Lead  . EKG 12-Lead    IMPRESSION AND PLAN:  Aimee Oliver  is a 76 y.o. female with a known history of Hypertension, hyperlipidemia, chronic arthritis is presenting to the ED with a chief complaint of intractable right hip pain which is chronically getting worse. Patient actually scheduled to get right hip replacement performed on Friday by Dr. Marry Guan.  #Acute on chronic right hip pain secondary to severe osteoarthritis and right hip joint effusion Admit to MedSurg unit Pain management as needed Consults orthopedics-Dr. Marry Guan Patient is actually scheduled to get hip arthroplasty  on Friday which will be preponed at Dr. Clydell Hakim descrition Holding aspirin  #Right hip joint effusion secondary to arthritis Pain management  as needed and orthopedics consult  #Hyperlipidemia continue statin  #Essential hypertension continue home medication metoprolol    All the records are reviewed and case discussed with ED provider. Management plans discussed with the patient, family and they are in agreement.  CODE STATUS: fc, husband HCPOA  TOTAL TIME TAKING CARE OF THIS PATIENT: 43  minutes.   Note: This dictation was prepared with Dragon dictation along with smaller phrase technology. Any transcriptional errors that result from this process are unintentional.  Nicholes Mango M.D on 01/23/2017 at 5:28 PM  Between 7am to 6pm - Pager - [phone]  After 6pm go to [email or web address] - password [email or web address] [facility]  Office  [phone]  CC: Primary care physician; Hortencia Pilar, MD

## 2017-01-23 NOTE — ED Triage Notes (Signed)
Pt arrives via EMS from home, pt is scheduled for a right hip replacement on Friday but states increased right hip pain mainly from her knee up, leg warm and dry, color WNL, cap refill <3 secs, awake and alert in no acute distress

## 2017-01-24 ENCOUNTER — Encounter: Payer: Self-pay | Admitting: Orthopedic Surgery

## 2017-01-24 LAB — BASIC METABOLIC PANEL
ANION GAP: 7 (ref 5–15)
BUN: 14 mg/dL (ref 6–20)
CALCIUM: 9.3 mg/dL (ref 8.9–10.3)
CHLORIDE: 104 mmol/L (ref 101–111)
CO2: 26 mmol/L (ref 22–32)
Creatinine, Ser: 0.94 mg/dL (ref 0.44–1.00)
GFR calc Af Amer: >60 mL/min (ref >60)
GFR calc non Af Amer: 57 mL/min — ABNORMAL LOW (ref >60)
GLUCOSE: 106 mg/dL — AB (ref 65–99)
Potassium: 3.9 mmol/L (ref 3.5–5.1)
Sodium: 137 mmol/L (ref 135–145)

## 2017-01-24 LAB — CBC
HEMATOCRIT: 34.2 % — AB (ref 35.0–47.0)
HEMOGLOBIN: 11.6 g/dL — AB (ref 12.0–16.0)
MCH: 33.3 pg (ref 26.0–34.0)
MCHC: 33.9 g/dL (ref 32.0–36.0)
MCV: 98.2 fL (ref 80.0–100.0)
Platelets: 189 10*3/uL (ref 150–440)
RBC: 3.48 MIL/uL — ABNORMAL LOW (ref 3.80–5.20)
RDW: 15.1 % — AB (ref 11.5–14.5)
WBC: 7.4 10*3/uL (ref 3.6–11.0)

## 2017-01-24 NOTE — Evaluation (Signed)
Physical Therapy Evaluation Patient Details Name: Aimee Oliver MRN: [id number] DOB: [birth year] Today's Date: 01/24/2017   History of Present Illness  76 y.o. female with a known history of Hypertension, hyperlipidemia, chronic arthritis is presenting with a chief complaint of intractable right hip pain which is chronically getting worse. Patient actually scheduled to get right hip replacement performed on Friday, possibility of lumbar issues has complicated the situation.    Clinical Impression  Pt in severe pain with even very limited/minimal R LE movement.  Pt willing tro try multiple times to get to EOB, but each attempt led to severe groin pain and literal crying.  Pt is not at all safe to try and go home and per today's performance would need to go to rehab facility. Pt was scheduled for R hip sx (replacement?) 6/22, this is still scheduled and likely planned per neuro f/u recommendations.     Follow Up Recommendations SNF    Equipment Recommendations  Rolling walker with 5" wheels    Recommendations for Other Services       Precautions / Restrictions Precautions Precautions: Fall Restrictions Weight Bearing Restrictions: No      Mobility  Bed Mobility               General bed mobility comments: attempted to move LEs toward EOB/to roll to side - pt screaming in pain and literally crying with even minimal movement - unable to get to sitting w/o severe pain further movement deferred   Transfers                    Ambulation/Gait                Stairs            Wheelchair Mobility    Modified Rankin (Stroke Patients Only)       Balance Overall balance assessment: Needs assistance     Sitting balance - Comments: pt could not even manage getting to sitting at EOB, highly pain limited                                     Pertinent Vitals/Pain Pain Assessment: 0-10 Pain Score: 10-Worst pain ever Pain Location: R groin,  thigh and minimally down into lower leg    Home Living Family/patient expects to be discharged to:: Private residence Living Arrangements: Spouse/significant other     Home Access: Stairs to enter       Home Equipment: Cane - single point      Prior Function Level of Independence: Independent         Comments: Until ~1 week ago pt would have hip issues, but could do basic activities and some prlonged walking, pain has become so severe as to be unable to walk the last 2 days     Hand Dominance        Extremity/Trunk Assessment   Upper Extremity Assessment Upper Extremity Assessment: Overall WFL for tasks assessed;Generalized weakness    Lower Extremity Assessment Lower Extremity Assessment: RLE deficits/detail (L LE WFL, but limited secondary to R LE pain) RLE Deficits / Details: Pt unable to tolerate even the smallest minimal movement of hip and knee, she was able to do resisted and AROM ankle PF/DF RLE: Unable to fully assess due to pain RLE Sensation:  (hyper-responsive to any touch, but indicated =b/l sensation)       Communication  Communication: No difficulties  Cognition Arousal/Alertness: Awake/alert Behavior During Therapy: Anxious Overall Cognitive Status: Within Functional Limits for tasks assessed                                        General Comments      Exercises     Assessment/Plan    PT Assessment Patient needs continued PT services  PT Problem List Decreased strength;Decreased range of motion;Decreased activity tolerance;Decreased balance;Decreased mobility;Decreased coordination;Decreased knowledge of use of DME;Pain;Decreased safety awareness       PT Treatment Interventions DME instruction;Gait training;Stair training;Functional mobility training;Therapeutic activities;Therapeutic exercise;Balance training;Patient/family education    PT Goals (Current goals can be found in the Care Plan section)  Acute Rehab PT  Goals Patient Stated Goal: get pain under control PT Goal Formulation: With patient Time For Goal Achievement: 02/07/17 Potential to Achieve Goals: Fair    Frequency Min 2X/week   Barriers to discharge        Co-evaluation               AM-PAC PT "6 Clicks" Daily Activity  Outcome Measure Difficulty turning over in bed (including adjusting bedclothes, sheets and blankets)?: Total Difficulty moving from lying on back to sitting on the side of the bed? : Total Difficulty sitting down on and standing up from a chair with arms (e.g., wheelchair, bedside commode, etc,.)?: Total Help needed moving to and from a bed to chair (including a wheelchair)?: Total Help needed walking in hospital room?: Total Help needed climbing 3-5 steps with a railing? : Total 6 Click Score: 6    End of Session   Activity Tolerance: Patient limited by pain     PT Visit Diagnosis: Pain;Muscle weakness (generalized) (M62.81);Difficulty in walking, not elsewhere classified (R26.2) Pain - Right/Left: Right Pain - part of body: Hip    Time: 3202-3343 PT Time Calculation (min) (ACUTE ONLY): 23 min   Charges:   PT Evaluation $PT Eval Low Complexity: 1 Procedure     PT G Codes:   PT G-Codes **NOT FOR INPATIENT CLASS** Functional Assessment Tool Used: AM-PAC 6 Clicks Basic Mobility Functional Limitation: Mobility: Walking and moving around Mobility: Walking and Moving Around Current Status (H6861): 100 percent impaired, limited or restricted Mobility: Walking and Moving Around Goal Status (U8372): At least 40 percent but less than 60 percent impaired, limited or restricted    Kreg Shropshire, DPT 01/24/2017, 5:04 PM

## 2017-01-24 NOTE — Progress Notes (Addendum)
PT is recommending SNF. Clinical Social Worker (CSW) met with patient and made her aware of above. Per patient she is getting an epidural tomorrow which she feels will greatly improve her mobility. Patient reported that she will consider SNF. FL2 complete and faxed out.   CSW presented bed offers and patient chose [facility]. Per [facility] admissions coordinator at [facility] she will start [facility] authorization tomorrow pending surgical plans.   [employer], LCSW [phone]

## 2017-01-24 NOTE — Progress Notes (Signed)
[facility] [place] NAME: Aimee Oliver    MR#:  [id number]  DATE OF BIRTH:  1941/07/27  SUBJECTIVE:  Came in with significant right hip pain and unable to ambulate at home. Patient states her pain is better. She has been taking some Vicodin.  REVIEW OF SYSTEMS:   Review of Systems  Constitutional: Negative for chills, fever and weight loss.  HENT: Negative for ear discharge, ear pain and nosebleeds.   Eyes: Negative for blurred vision, pain and discharge.  Respiratory: Negative for sputum production, shortness of breath, wheezing and stridor.   Cardiovascular: Negative for chest pain, palpitations, orthopnea and PND.  Gastrointestinal: Negative for abdominal pain, diarrhea, nausea and vomiting.  Genitourinary: Negative for frequency and urgency.  Musculoskeletal: Positive for joint pain. Negative for back pain.  Neurological: Positive for weakness. Negative for sensory change, speech change and focal weakness.  Psychiatric/Behavioral: Negative for depression and hallucinations. The patient is not nervous/anxious.    Tolerating Diet: yes Tolerating PT: pending  DRUG ALLERGIES:  No Known Allergies  VITALS:  Blood pressure (!) 115/59, pulse 88, temperature 98.7 F (37.1 C), temperature source Oral, resp. rate 16, height 5\' 5"  (1.651 m), weight 73.7 kg (162 lb 8 oz), SpO2 96 %.  PHYSICAL EXAMINATION:   Physical Exam  GENERAL:  76 y.o.-year-old patient lying in the bed with no acute distress.  EYES: Pupils equal, round, reactive to light and accommodation. No scleral icterus. Extraocular muscles intact.  HEENT: Head atraumatic, normocephalic. Oropharynx and nasopharynx clear.  NECK:  Supple, no jugular venous distention. No thyroid enlargement, no tenderness.  LUNGS: Normal breath sounds bilaterally, no wheezing, rales, rhonchi. No use of accessory muscles of respiration.  CARDIOVASCULAR: S1, S2 normal. No murmurs, rubs, or gallops.   ABDOMEN: Soft, nontender, nondistended. Bowel sounds present. No organomegaly or mass.  EXTREMITIES: No cyanosis, clubbing or edema b/l.    NEUROLOGIC: Cranial nerves II through XII are intact. No focal Motor or sensory deficits b/l.  Right hip pain limited rom PSYCHIATRIC:  patient is alert and oriented x 3.  SKIN: No obvious rash, lesion, or ulcer.   LABORATORY PANEL:  CBC  Recent Labs Lab 01/24/17 0406  WBC 7.4  HGB 11.6*  HCT 34.2*  PLT 189    Chemistries   Recent Labs Lab 01/24/17 0406  NA 137  K 3.9  CL 104  CO2 26  GLUCOSE 106*  BUN 14  CREATININE 0.94  CALCIUM 9.3   Cardiac Enzymes No results for input(s): TROPONINI in the last 168 hours. RADIOLOGY:  Mr Lumbar Spine Wo Contrast  Result Date: 01/24/2017 CLINICAL DATA:  Initial evaluation for intractable right hip pain. EXAM: MRI LUMBAR SPINE WITHOUT CONTRAST TECHNIQUE: Multiplanar, multisequence MR imaging of the lumbar spine was performed. No intravenous contrast was administered. COMPARISON:  None. FINDINGS: Segmentation: Normal segmentation. Lowest well-formed disc labeled the L5-S1 level. Alignment: 5 mm anterolisthesis of L4 on L5. Prominent scoliosis. Mild exaggeration of the normal lumbar lordosis. Vertebrae: Vertebral body heights are maintained. No evidence for acute or chronic fracture. Scattered chronic reactive endplate changes present throughout the lumbar spine extending from T12-L1 through L4-5. Associated chronic degenerative endplate Schmorl's nodes. Benign hemangioma present within the L1 vertebral body. No concerning osseous lesions. No abnormal marrow edema. Signal intensity within the vertebral body bone marrow within normal limits. Conus medullaris: Extends to the L1 level and appears normal. Paraspinal and other soft tissues: Paraspinous soft tissues within normal limits. Asymmetric chronic atrophy  noted within the right psoas muscle. Fatty atrophy noted within the lower paraspinous musculature is  well. Visualized visceral structures within normal limits. Disc levels: T12-L1: Chronic diffuse degenerative disc bulge with intervertebral disc space narrowing and disc desiccation. Chronic reactive endplate changes, greater on the left. No significant canal stenosis. Mild left foraminal narrowing. No significant right foraminal encroachment. L1-2: Chronic diffuse degenerative disc bulge with intervertebral disc space narrowing and disc desiccation. Chronic reactive endplate changes. Facet and ligamentum flavum hypertrophy. Resultant mild canal and left lateral recess stenosis. Mild left L1 foraminal narrowing. No significant right foraminal encroachment. L2-3: Chronic diffuse degenerative disc bulge with intervertebral disc space narrowing and reactive endplate changes. Moderate facet and ligamentum flavum hypertrophy. Resultant mild canal with bilateral lateral recess narrowing, slightly greater on the left. No significant foraminal encroachment bilaterally. L3-4: Chronic diffuse degenerative disc bulge with intervertebral disc space narrowing. Reactive endplate minimal bilateral L3 foraminal narrowing related to degenerative disc bulge, reactive endplate changes, and facet disease. L4-5: Chronic 5 mm anterolisthesis of L4 on L5. Associated broad posterior disc bulge, most prevalent centrally. Moderate to advanced bilateral facet arthrosis. Resultant mild to moderate canal and bilateral subarticular stenosis. Moderate right L4 foraminal narrowing, potentially affecting the exiting right L4 nerve root (series 3, image 9). Mild left foraminal stenosis. L5-S1: Diffuse degenerative disc bulge with disc desiccation. Mild reactive endplate changes. Moderate bilateral facet arthrosis, right worse than left. No significant canal stenosis. Mild bilateral foraminal narrowing, slightly greater on the right. IMPRESSION: 1. Chronic 5 mm anterolisthesis of L4 on L5 with associated disc bulge and advanced facet arthropathy,  resulting in mild-to-moderate canal and bilateral subarticular stenosis moderate right L4 foraminal narrowing. 2. Scoliosis with additional moderate multilevel degenerative spondylolysis and facet arthrosis as above. No other significant canal or foraminal stenosis identified. No other evidence for frank neural impingement. Please see above report for a full description of these findings. Electronically Signed   By: Jeannine Boga M.D.   On: 01/24/2017 01:50   Mr Hip Right Wo Contrast  Result Date: 01/23/2017 CLINICAL DATA:  76 year old with history of arthritis, scheduled for right hip arthroplasty 01/27/2017. Increasing right hip pain with ambulation over the last day. No acute injury. No leukocytosis or reported fever. EXAM: MR OF THE RIGHT HIP WITHOUT CONTRAST TECHNIQUE: Multiplanar, multisequence MR imaging was performed. No intravenous contrast was administered. COMPARISON:  Radiographs 01/23/2017.  No previous studies available. FINDINGS: Bones: In correlation with today's radiographs, there is asymmetric arthropathy involving the right hip joint. There is axial migration of the right femoral head with subchondral cyst formation and edema in the femoral head, neck and acetabulum. There is mild subchondral cyst formation posteriorly in the left femoral head. No specific evidence of avascular necrosis. The visualized sacroiliac joints and symphysis pubis appear normal. Lower lumbar spondylosis noted. Articular cartilage and labrum Articular cartilage: As above, asymmetric right hip arthropathy with axial migration, diffuse chondral thinning, subchondral cyst formation and edema. Labrum: There is no gross labral tear or paralabral abnormality. Joint or bursal effusion Joint effusion: Moderate size right hip joint effusion appears mildly complex with periarticular soft tissue edema. There is a small amount of hip joint fluid on the left. Bursae: No focal bursal fluid collections. Muscles and tendons  Muscles and tendons: There is some T2 hyperintensity within the right hip adductor musculature. The piriformis musculature appears normal. The gluteus, iliopsoas and hamstring tendons appear unremarkable. Other findings Miscellaneous: Sigmoid colon diverticular changes are noted. Evidence of previous hysterectomy. The visualized internal pelvic  contents otherwise appear unremarkable. IMPRESSION: 1. Asymmetric right hip arthropathy with asymmetric complex right hip joint effusion and periarticular edema. 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 2. No evidence of acute fracture, dislocation or avascular necrosis. 3. Mild left hip arthropathy. 4. Lower lumbar spondylosis. Electronically Signed   By: Richardean Sale M.D.   On: 01/23/2017 17:03   Dg Hip Unilat W Or Wo Pelvis 2-3 Views Right  Result Date: 01/23/2017 CLINICAL DATA:  Chronic right hip pain. EXAM: DG HIP (WITH OR WITHOUT PELVIS) 2-3V RIGHT COMPARISON:  No prior. FINDINGS: Degenerative changes in the lumbar spine and both hips. Sclerotic changes both femoral heads. Avascular necrosis cannot be excluded. Pelvic calcifications noted consistent phleboliths. Peripheral vascular calcification. IMPRESSION: 1. Degenerative changes lumbar spine and both hips. 2. Sclerotic changes both femoral heads. Avascular necrosis cannot be excluded. 3. Peripheral vascular disease . Electronically Signed   By: Marcello Moores  Register   On: 01/23/2017 15:07   ASSESSMENT AND PLAN:  Deona Novitski  is a 76 y.o. female with a known history of Hypertension, hyperlipidemia, chronic arthritis is presenting to the ED with a chief complaint of intractable right hip pain which is chronically getting worse. Patient actually scheduled to get right hip replacement performed on Friday by Dr. Marry Guan.  #Acute on chronic right hip pain secondary to severe osteoarthritis and right hip joint effusion Pain management as needed Consults orthopedics-Dr. Marry Guan noted. Plan  is to get MRI lumbar spine reviewed by [facility] neurosurgery. -Dr. Marry Guan plans for right hip arthroplasty on Friday Holding aspirin -In the meantime PT will evaluate patient and see if she is able to ambulate.  #Right hip joint effusion secondary to arthritis Pain management as needed   #Hyperlipidemia continue statin  #Essential hypertension continue home medication metoprolol  Discussed with patient and family Case discussed with Dr. Marry Guan Case discussed with Care Management/Social Worker. Management plans discussed with the patient, family and they are in agreement.  CODE STATUS: full  DVT Prophylaxis: SCD  TOTAL TIME TAKING CARE OF THIS PATIENT: *30* minutes.  >50% time spent on counselling and coordination of care  POSSIBLE D/C IN  1-2 DAYS, DEPENDING ON CLINICAL CONDITION.  Note: This dictation was prepared with Dragon dictation along with smaller phrase technology. Any transcriptional errors that result from this process are unintentional.  Emile Ringgenberg M.D on 01/24/2017 at 11:41 AM  Between 7am to 6pm - Pager - [phone]  After 6pm go to [email or web address] - password [email or web address] [facility]  Office  [phone]  CC: Primary care physician; Hortencia Pilar, MD

## 2017-01-24 NOTE — Consult Note (Signed)
ORTHOPAEDIC CONSULTATION  PATIENT NAME: Aimee Oliver DOB: [birth year]  MRN: [id number]  REQUESTING PHYSICIAN: Fritzi Mandes, MD  Chief Complaint: Right hip and leg pain  HPI: Aimee Oliver is a 76 y.o. female who complains of  right hip and leg pain. The patient was evaluated in the office in March 2018 with some complaints of lower back pain with radiation into the buttocks and thigh as well as some radiation into the right groin. At that time she did have some occasional pain extending all the way down the right lower extremity to the level of the foot. Due to continuation of symptoms, an attempt was made to order an MRI of the lumbar spine at that time but request was denied by her insurance company pending 6 weeks of physical therapy. She attended physical therapy but actually had aggravation of her right groin pain consistent with her radiographic findings of degenerative arthrosis of the right hip. We had a discussion at that time that her pain was probably multifactorial in nature related to changes of the lumbar spine as well as of the right hip. She had tentatively been scheduled for right total hip arthroplasty for this coming Friday.  The patient states that she had an active day shopping last Friday but rested on Saturday. She apparently woke early in the morning on Sunday with severe pain that extended down the lateral aspect of the thigh and lower leg to the lateral aspect of the foot. Since that time she has had difficulty with ambulation or even movement of the leg due to the pain. She denied any bowel or bladder dysfunction. She denied any gross numbness. She complained of "weakness", but believes that her difficulty with movement may be secondary to guarding due to the pain. She was evaluated in the Emergency Department and admitted by the hospitalist for pain management.  Past Medical History:  Diagnosis Date  . Acoustic neuroma ([place])   . Anemia    boarderline anemic  . Arthritis   .  Headache    sinus head aches  . Hypercholesterolemia   . Hypertension   . PONV (postoperative nausea and vomiting)    with hysterectomy   Past Surgical History:  Procedure Laterality Date  . ABDOMINAL HYSTERECTOMY    . CATARACT EXTRACTION    . FRACTURE SURGERY Left 2015   wrist  . FRACTURE SURGERY Left 1956   ankle  . PLACEMENT OF BREAST IMPLANTS    . Right total knee arthroplasty  08/13/2010   Dr. Marry Guan   Social History   Social History  . Marital status: Divorced    Spouse name: N/A  . Number of children: N/A  . Years of education: N/A   Social History Main Topics  . Smoking status: Never Smoker  . Smokeless tobacco: Never Used  . Alcohol use None     Comment: 1 cocktail every night  . Drug use: No  . Sexual activity: Not Asked   Other Topics Concern  . None   Social History Narrative  . None   History reviewed. No pertinent family history. No Known Allergies Prior to Admission medications   Medication Sig Start Date End Date Taking? Authorizing Provider  acetaminophen (TYLENOL) 650 MG CR tablet Take 1,300 mg by mouth 3 (three) times daily.   Yes [provider]  Ascorbic Acid (VITAMIN C PO) Take 1 tablet by mouth daily as needed (for immune system booster).   Yes [provider]  aspirin EC 81 MG  tablet Take 81 mg by mouth daily.   Yes [provider]  Calcium Carbonate-Vit D-Min (CALTRATE 600+D PLUS PO) Take 1 tablet by mouth daily.   Yes [provider]  Cranberry-Vitamin C-Probiotic (AZO CRANBERRY PO) Take 1 tablet by mouth daily as needed (for UTI prevention).   Yes [provider]  Lactobacillus (ACIDOPHILUS PO) Take 1 capsule by mouth daily.   Yes [provider]  metoprolol succinate (TOPROL-XL) 50 MG 24 hr tablet Take 50 mg by mouth at bedtime. 12/06/16  Yes [provider]  Multiple Vitamin (MULTIVITAMIN WITH MINERALS) TABS tablet Take 1 tablet by mouth daily. One-A-Day Women's 50+   Yes  [provider]  omeprazole (PRILOSEC) 20 MG capsule Take 20 mg by mouth daily before breakfast. 12/06/16  Yes [provider]  [place] [facility] Take 1 tablet by mouth 2 (two) times daily. PHYSIO OMEGA (a blend of DPA, EPA, & DHA FATTY ACIDS)   Yes [provider]  OVER THE COUNTER MEDICATION 1 tablet at bedtime. Applied nutrition Anti-Aging total body daily defense, Co Q10, Vitamin D3, Resveratrol, ginkgo, green tea, lutein and Omega-3 oils   Yes [provider]  phenylephrine (SUDAFED PE) 10 MG TABS tablet Take 10 mg by mouth every 4 (four) hours as needed.   Yes [provider]  rosuvastatin (CRESTOR) 40 MG tablet Take 40 mg by mouth at bedtime.   Yes [provider]   Mr Lumbar Spine Wo Contrast  Result Date: 01/24/2017 CLINICAL DATA:  Initial evaluation for intractable right hip pain. EXAM: MRI LUMBAR SPINE WITHOUT CONTRAST TECHNIQUE: Multiplanar, multisequence MR imaging of the lumbar spine was performed. No intravenous contrast was administered. COMPARISON:  None. FINDINGS: Segmentation: Normal segmentation. Lowest well-formed disc labeled the L5-S1 level. Alignment: 5 mm anterolisthesis of L4 on L5. Prominent scoliosis. Mild exaggeration of the normal lumbar lordosis. Vertebrae: Vertebral body heights are maintained. No evidence for acute or chronic fracture. Scattered chronic reactive endplate changes present throughout the lumbar spine extending from T12-L1 through L4-5. Associated chronic degenerative endplate Schmorl's nodes. Benign hemangioma present within the L1 vertebral body. No concerning osseous lesions. No abnormal marrow edema. Signal intensity within the vertebral body bone marrow within normal limits. Conus medullaris: Extends to the L1 level and appears normal. Paraspinal and other soft tissues: Paraspinous soft tissues within normal limits. Asymmetric chronic atrophy noted within the right psoas muscle. Fatty atrophy  noted within the lower paraspinous musculature is well. Visualized visceral structures within normal limits. Disc levels: T12-L1: Chronic diffuse degenerative disc bulge with intervertebral disc space narrowing and disc desiccation. Chronic reactive endplate changes, greater on the left. No significant canal stenosis. Mild left foraminal narrowing. No significant right foraminal encroachment. L1-2: Chronic diffuse degenerative disc bulge with intervertebral disc space narrowing and disc desiccation. Chronic reactive endplate changes. Facet and ligamentum flavum hypertrophy. Resultant mild canal and left lateral recess stenosis. Mild left L1 foraminal narrowing. No significant right foraminal encroachment. L2-3: Chronic diffuse degenerative disc bulge with intervertebral disc space narrowing and reactive endplate changes. Moderate facet and ligamentum flavum hypertrophy. Resultant mild canal with bilateral lateral recess narrowing, slightly greater on the left. No significant foraminal encroachment bilaterally. L3-4: Chronic diffuse degenerative disc bulge with intervertebral disc space narrowing. Reactive endplate minimal bilateral L3 foraminal narrowing related to degenerative disc bulge, reactive endplate changes, and facet disease. L4-5: Chronic 5 mm anterolisthesis of L4 on L5. Associated broad posterior disc bulge, most prevalent centrally. Moderate to advanced bilateral facet arthrosis. Resultant mild  to moderate canal and bilateral subarticular stenosis. Moderate right L4 foraminal narrowing, potentially affecting the exiting right L4 nerve root (series 3, image 9). Mild left foraminal stenosis. L5-S1: Diffuse degenerative disc bulge with disc desiccation. Mild reactive endplate changes. Moderate bilateral facet arthrosis, right worse than left. No significant canal stenosis. Mild bilateral foraminal narrowing, slightly greater on the right. IMPRESSION: 1. Chronic 5 mm anterolisthesis of L4 on L5 with  associated disc bulge and advanced facet arthropathy, resulting in mild-to-moderate canal and bilateral subarticular stenosis moderate right L4 foraminal narrowing. 2. Scoliosis with additional moderate multilevel degenerative spondylolysis and facet arthrosis as above. No other significant canal or foraminal stenosis identified. No other evidence for frank neural impingement. Please see above report for a full description of these findings. Electronically Signed   By: Jeannine Boga M.D.   On: 01/24/2017 01:50   Mr Hip Right Wo Contrast  Result Date: 01/23/2017 CLINICAL DATA:  76 year old with history of arthritis, scheduled for right hip arthroplasty 01/27/2017. Increasing right hip pain with ambulation over the last day. No acute injury. No leukocytosis or reported fever. EXAM: MR OF THE RIGHT HIP WITHOUT CONTRAST TECHNIQUE: Multiplanar, multisequence MR imaging was performed. No intravenous contrast was administered. COMPARISON:  Radiographs 01/23/2017.  No previous studies available. FINDINGS: Bones: In correlation with today's radiographs, there is asymmetric arthropathy involving the right hip joint. There is axial migration of the right femoral head with subchondral cyst formation and edema in the femoral head, neck and acetabulum. There is mild subchondral cyst formation posteriorly in the left femoral head. No specific evidence of avascular necrosis. The visualized sacroiliac joints and symphysis pubis appear normal. Lower lumbar spondylosis noted. Articular cartilage and labrum Articular cartilage: As above, asymmetric right hip arthropathy with axial migration, diffuse chondral thinning, subchondral cyst formation and edema. Labrum: There is no gross labral tear or paralabral abnormality. Joint or bursal effusion Joint effusion: Moderate size right hip joint effusion appears mildly complex with periarticular soft tissue edema. There is a small amount of hip joint fluid on the left. Bursae: No  focal bursal fluid collections. Muscles and tendons Muscles and tendons: There is some T2 hyperintensity within the right hip adductor musculature. The piriformis musculature appears normal. The gluteus, iliopsoas and hamstring tendons appear unremarkable. Other findings Miscellaneous: Sigmoid colon diverticular changes are noted. Evidence of previous hysterectomy. The visualized internal pelvic contents otherwise appear unremarkable. IMPRESSION: 1. Asymmetric right hip arthropathy with asymmetric complex right hip joint effusion and periarticular edema. 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 2. No evidence of acute fracture, dislocation or avascular necrosis. 3. Mild left hip arthropathy. 4. Lower lumbar spondylosis. Electronically Signed   By: Richardean Sale M.D.   On: 01/23/2017 17:03   Dg Hip Unilat W Or Wo Pelvis 2-3 Views Right  Result Date: 01/23/2017 CLINICAL DATA:  Chronic right hip pain. EXAM: DG HIP (WITH OR WITHOUT PELVIS) 2-3V RIGHT COMPARISON:  No prior. FINDINGS: Degenerative changes in the lumbar spine and both hips. Sclerotic changes both femoral heads. Avascular necrosis cannot be excluded. Pelvic calcifications noted consistent phleboliths. Peripheral vascular calcification. IMPRESSION: 1. Degenerative changes lumbar spine and both hips. 2. Sclerotic changes both femoral heads. Avascular necrosis cannot be excluded. 3. Peripheral vascular disease . Electronically Signed   By: Marcello Moores  Register   On: 01/23/2017 15:07    Positive ROS: All other systems have been reviewed and were otherwise negative with the exception of those mentioned in the HPI and as above.  Physical Exam: General:  Well developed, well nourished female seen in no acute distress. HEENT: Atraumatic and normocephalic. Sclera are clear. Extraocular motion is intact. Oropharynx is clear with moist mucosa. Neck: Supple, nontender, good range of motion. No JVD or carotid bruits. Lungs: Clear  to auscultation bilaterally. Cardiovascular: Regular rate and rhythm with normal S1 and S2. No murmurs. No gallops or rubs. Pedal pulses are palpable bilaterally. Homans test is negative bilaterally. No significant pretibial or ankle edema. Abdomen: Soft, nontender, and nondistended. Bowel sounds are present. Skin: No lesions in the area of chief complaint Neurologic: Awake, alert, and oriented. Sensory function is grossly intact with the exception of slight decreased discrimination to pinprick and light touch along the lateral aspect of the lower right thigh. Motor strength is felt to be 5 over 5 bilaterally with the exception of the right lower extremity which was difficult to assess secondary to significant guarding. Slight decrease in foot plantar flexion strength was appreciated on the right.. No clonus or tremor. Good motor coordination. Lymphatic: No axillary or cervical lymphadenopathy  MUSCULOSKELETAL: Evaluation of the right lower extremity showed no gross swelling or effusion to the right ankle or right knee. No gross tenderness to palpation about the knee or ankle. The patient demonstrated significant guarding with any attempt at active or passive range of motion of the right hip or knee. She was unable to perform heel slide on the right and did demonstrate some muscle quivering to the right thigh. Slight spasm to the right lower extremity musculature was appreciated. No significant tenderness to palpation about the right greater trochanter or lumbar spine. Straight leg raise was grossly positive on the right and negative on the left.  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 I explained that the findings from the lumbar MRI to  demonstrate an etiology for some of the patient's radicular symptoms down the right lower extremity. It is difficult to discern how much of the patient's pain can be attributable to the lumbar pathology versus hip pathology. I would like to review the lumbar MRI findings with one of the [facility] neurosurgeons and obtain their recommendations.  I anticipate proceeding with plans for right total hip arthroplasty pending my discussion with Neurosurgery.  James P. Holley Bouche M.D.

## 2017-01-24 NOTE — Clinical Social Work Placement (Signed)
   CLINICAL SOCIAL WORK PLACEMENT  NOTE  Date:  01/24/2017  Patient Details  Name: Aimee Oliver MRN: [id number] Date of Birth: [birth year]  Clinical Social Work is seeking post-discharge placement for this patient at the [place] level of care (*CSW will initial, date and re-position this form in  chart as items are completed):  Yes   Patient/family provided with [facility] Work Department's list of facilities offering this level of care within the geographic area requested by the patient (or if unable, by the patient's family).  Yes   Patient/family informed of their freedom to choose among providers that offer the needed level of care, that participate in Medicare, Medicaid or managed care program needed by the patient, have an available bed and are willing to accept the patient.  Yes   Patient/family informed of [facility]'s ownership interest in [facility] and [facility], as well as of the fact that they are under no obligation to receive care at these facilities.  PASRR submitted to EDS on 01/24/17     PASRR number received on 01/24/17     Existing PASRR number confirmed on       FL2 transmitted to all facilities in geographic area requested by pt/family on 01/24/17     FL2 transmitted to all facilities within larger geographic area on       Patient informed that his/her managed care company has contracts with or will negotiate with certain facilities, including the following:            Patient/family informed of bed offers received.  Patient chooses bed at       Physician recommends and patient chooses bed at      Patient to be transferred to   on  .  Patient to be transferred to facility by       Patient family notified on   of transfer.  Name of family member notified:        PHYSICIAN       Additional Comment:    _______________________________________________ Asja Frommer, Veronia Beets, LCSW 01/24/2017, 3:31 PM

## 2017-01-24 NOTE — Clinical Social Work Note (Signed)
Clinical Social Work Assessment  Patient Details  Name: Aimee Oliver MRN: [id number] Date of Birth: [birth year]  Date of referral:  01/24/17               Reason for consult:  Facility Placement                Permission sought to share information with:    Permission granted to share information::     Name::        Agency::     Relationship::     Contact Information:     Housing/Transportation Living arrangements for the past 2 months:  Single Family Home Source of Information:  Patient, Friend/Neighbor Patient Interpreter Needed:  None Criminal Activity/Legal Involvement Pertinent to Current Situation/Hospitalization:  No - Comment as needed Significant Relationships:  Friend Lives with:  Roommate, Friends Do you feel safe going back to the place where you live?  Yes Need for family participation in patient care:  Yes (Comment)  Care giving concerns:  Patient lives in [name] with her friend/ roommate Shanon Brow.    Social Worker assessment / plan:  Holiday representative ([place]) reviewed chart and noted that patient will likely have surgery. CSW met with patient and her friend Shanon Brow was at bedside. Patient was alert and oriented X4 and was laying in the bed. Patient reported that Shanon Brow has lived with her for 20 years and his her friend and caregiver. Patient reported that Shanon Brow took very good care of her after her knee surgery. Patient reported that she believed she was going home from the ED last night however she got admitted. CSW explained that PT will work with patient and make a recommendation of home health or SNF. Patient reported that she is going home with home health and Shanon Brow will take care of her. Patient reported that she would not consider SNF. RN case manager aware of above. CSW will continue to follow and assist as needed.   Employment status:  Retired Nurse, adult PT Recommendations:  Not assessed at this time Information / Referral to community  resources:  Other (Comment Required) (Patient prefers home health )  Patient/Family's Response to care:  Patient prefers to go home with home health.   Patient/Family's Understanding of and Emotional Response to Diagnosis, Current Treatment, and Prognosis:  Patient was very pleasant and thanked CSW for visit.   Emotional Assessment Appearance:  Appears stated age Attitude/Demeanor/Rapport:    Affect (typically observed):  Accepting, Adaptable, Pleasant Orientation:  Oriented to Self, Oriented to Place, Oriented to  Time, Oriented to Situation Alcohol / Substance use:  Not Applicable Psych involvement (Current and /or in the community):  No (Comment)  Discharge Needs  Concerns to be addressed:  Discharge Planning Concerns Readmission within the last 30 days:  No Current discharge risk:  Dependent with Mobility Barriers to Discharge:  Continued Medical Work up   [employer], Veronia Beets, LCSW 01/24/2017, 11:10 AM

## 2017-01-24 NOTE — Progress Notes (Signed)
New Admit  Arrival Method: From ED Mental Orientation: A&OX4. Anxious. irritable. Telemetry: No Assessment: Pain to R hip extending down the right leg even with minimal movement. Pedal pulses 2+ bilat. R plantar flexion very minimal, Dorsiflexion weak. LLE full movement. Pt can not bear wt on RLE at this time.  Lungs clear. No adventitious heart sounds.  Skin: Intact. Small bruise to L foot. Dry. Iv: 22 G L wrist, saline locked Pain: 9/10 pain, medications administered. Safety Measures: Bed low. Fall risk signage. Bed alarm. Near nursing station. Active listening. SCDs and TEDs. MRSA negative. Admission: Complete 1A Orientation: Complete Family: Updated, at bedside. Supportive.  External Female catheter applied. MRI complete.

## 2017-01-24 NOTE — NC FL2 (Signed)
[place] LEVEL OF CARE SCREENING TOOL     IDENTIFICATION  Patient Name: Aimee Oliver Birthdate: 04/20/1941 Sex: female Admission Date (Current Location): 01/23/2017  [place] and [place] Number:  Engineering geologist and Address:  [facility], [address] [place], [place] [address]      Provider Number: [id number]  Attending Physician Name and Address:  Fritzi Mandes, MD  Relative Name and Phone Number:       Current Level of Care: Hospital Recommended Level of Care: [place] Prior Approval Number:    Date Approved/Denied:   PASRR Number:  [id number])  Discharge Plan: SNF    Current Diagnoses: Patient Active Problem List   Diagnosis Date Noted  . Acute hip pain, right 01/23/2017    Orientation RESPIRATION BLADDER Height & Weight     Self, Time, Situation, Place  Normal Continent Weight: 162 lb 8 oz (73.7 kg) Height:  5\' 5"  (165.1 cm)  BEHAVIORAL SYMPTOMS/MOOD NEUROLOGICAL BOWEL NUTRITION STATUS   (none)  (none) Continent Diet (Diet: Carb Modified )  AMBULATORY STATUS COMMUNICATION OF NEEDS Skin   Extensive Assist Verbally Surgical wounds                       Personal Care Assistance Level of Assistance  Bathing, Feeding, Dressing Bathing Assistance: Limited assistance Feeding assistance: Independent Dressing Assistance: Limited assistance     Functional Limitations Info  Sight, Hearing, Speech Sight Info: Adequate Hearing Info: Adequate Speech Info: Adequate    SPECIAL CARE FACTORS FREQUENCY  PT (By licensed PT), OT (By licensed OT)     PT Frequency:  (5) OT Frequency:  (5)            Contractures      Additional Factors Info  Code Status, Allergies Code Status Info:  (Full Code. ) Allergies Info:  (No Known Allergies. )           Current Medications (01/24/2017):  This is the current hospital active medication list Current Facility-Administered Medications  Medication  Dose Route Frequency Provider Last Rate Last Dose  . acetaminophen (TYLENOL) tablet 650 mg  650 mg Oral Q6H PRN Gouru, Aruna, MD       Or  . acetaminophen (TYLENOL) suppository 650 mg  650 mg Rectal Q6H PRN Gouru, Aruna, MD      . acidophilus (RISAQUAD) capsule 1 capsule  1 capsule Oral Daily Gouru, Aruna, MD      . calcium-vitamin D (OSCAL WITH D) 500-200 MG-UNIT per tablet 1 tablet  1 tablet Oral Daily Gouru, Aruna, MD      . docusate sodium (COLACE) capsule 100 mg  100 mg Oral BID Gouru, Aruna, MD   100 mg at 01/24/17 0908  . HYDROcodone-acetaminophen (NORCO/VICODIN) 5-325 MG per tablet 1-2 tablet  1-2 tablet Oral Q4H PRN Hooten, Laurice Record, MD   1 tablet at 01/24/17 0636  . lactated ringers infusion   Intravenous Continuous Martha Clan, MD   Stopped at 01/23/17 2240  . metoprolol succinate (TOPROL-XL) 24 hr tablet 50 mg  50 mg Oral QHS Gouru, Illene Silver, MD   50 mg at 01/23/17 2154  . morphine 2 MG/ML injection 2 mg  2 mg Intravenous Q4H PRN Gouru, Aruna, MD   2 mg at 01/23/17 2021  . multivitamin with minerals tablet 1 tablet  1 tablet Oral Daily Gouru, Aruna, MD      . ondansetron (ZOFRAN) tablet 4 mg  4 mg Oral Q6H  PRN Nicholes Mango, MD   4 mg at 01/23/17 2021   Or  . ondansetron (ZOFRAN) injection 4 mg  4 mg Intravenous Q6H PRN Gouru, Aruna, MD      . pantoprazole (PROTONIX) EC tablet 40 mg  40 mg Oral Daily Gouru, Aruna, MD      . rosuvastatin (CRESTOR) tablet 40 mg  40 mg Oral QHS Gouru, Illene Silver, MD   40 mg at 01/23/17 2154  . senna-docusate (Senokot-S) tablet 1 tablet  1 tablet Oral QHS PRN Nicholes Mango, MD         Discharge Medications: Please see discharge summary for a list of discharge medications.  Relevant Imaging Results:  Relevant Lab Results:   Additional Information  (SSN: [id number])  Delphina Schum, Veronia Beets, LCSW

## 2017-01-25 ENCOUNTER — Inpatient Hospital Stay: Payer: Medicare HMO

## 2017-01-25 ENCOUNTER — Encounter: Payer: Self-pay | Admitting: Interventional Radiology

## 2017-01-25 HISTORY — PX: IR FLUORO GUIDED NEEDLE PLC ASPIRATION/INJECTION LOC: IMG2395

## 2017-01-25 MED ORDER — METHYLPREDNISOLONE ACETATE 80 MG/ML IJ SUSP
120.0000 mg | Freq: Once | INTRAMUSCULAR | Status: AC
Start: 2017-01-25 — End: 2017-01-25
  Administered 2017-01-25: 120 mg via INTRA_ARTICULAR
  Filled 2017-01-25: qty 2

## 2017-01-25 MED ORDER — METHYLPREDNISOLONE ACETATE 80 MG/ML IJ SUSP
INTRAMUSCULAR | Status: AC
  Filled 2017-01-25: qty 2

## 2017-01-25 MED ORDER — IOPAMIDOL (ISOVUE-M 200) INJECTION 41%
20.0000 mL | Freq: Once | INTRAMUSCULAR | Status: AC
  Administered 2017-01-25: 5 mL via EPIDURAL

## 2017-01-25 MED ORDER — LIDOCAINE HCL (PF) 1 % IJ SOLN
INTRAMUSCULAR | Status: AC
  Filled 2017-01-25: qty 30

## 2017-01-25 NOTE — Progress Notes (Signed)
Subjective :Patient is day 2 admission for pain control right hip and leg discomfort. Progressing well she does not move the right hip. Minimal pain. Was unable to do therapy yesterday secondary to pain with movement of the leg. Patient has a bed offer [place] Patient scheduled for epidural steroid today at 2:00. Pending results of the epidural we'll decide on rehabilitation versus home. Most likely will need to go to rehabilitation. This pain not able to be controlled with epidural steroid injection will probably not be of this surgery on Friday as planned. no nausea and no vomiting Patient denies any chest pains or shortness of breath.   Objective: Vital signs in last 24 hours: Temp:  [97.8 F (36.6 C)-99.2 F (37.3 C)] 97.8 F (36.6 C) (06/20 0111) Pulse Rate:  [80-106] 80 (06/20 0111) Resp:  [16-20] 16 (06/20 0111) BP: (117-179)/(70-86) 117/70 (06/20 0111) SpO2:  [95 %-97 %] 97 % (06/20 0111)   Intake/Output from previous day: 06/19 0701 - 06/20 0700 In: 360 [P.O.:360] Out: 2150 [Urine:2150] Intake/Output this shift: Total I/O In: -  Out: 1450 [Urine:1450]   Recent Labs  01/23/17 1405 01/24/17 0406  HGB 12.7 11.6*    Recent Labs  01/23/17 1405 01/24/17 0406  WBC 10.3 7.4  RBC 3.79* 3.48*  HCT 36.9 34.2*  PLT 223 189    Recent Labs  01/23/17 1405 01/24/17 0406  NA 135 137  K 3.5 3.9  CL 104 104  CO2 23 26  BUN 16 14  CREATININE 1.02* 0.94  GLUCOSE 116* 106*  CALCIUM 9.6 9.3   No results for input(s): LABPT, INR in the last 72 hours.  Neurologically intact Neurovascular intact Sensation intact distally Intact pulses distally  Assessment/Plan:  Case management to assist with discharge planning. Patient has been offer [facility] Epidural steroid scheduled today at 2:00 Physical therapy today Bowel movement today Labs in am Plan to discharge after epidural.   Kiosha Buchan R. 01/25/2017, 6:54 AM

## 2017-01-25 NOTE — Procedures (Signed)
   R L4/5 epidural steroid injection under fluoro  No complication No blood loss. See complete dictation in [facility].  Dillard Cannon MD Main # [phone] Pager  [phone]

## 2017-01-25 NOTE — Progress Notes (Signed)
[facility] at [place] NAME: Aimee Oliver    MR#:  [id number]  DATE OF BIRTH:  1940/12/24  SUBJECTIVE:  Came in with significant right hip pain and unable to ambulate at home. Patient states her pain is better. She has been taking some Vicodin. Could not do PT due to severe pain  REVIEW OF SYSTEMS:   Review of Systems  Constitutional: Negative for chills, fever and weight loss.  HENT: Negative for ear discharge, ear pain and nosebleeds.   Eyes: Negative for blurred vision, pain and discharge.  Respiratory: Negative for sputum production, shortness of breath, wheezing and stridor.   Cardiovascular: Negative for chest pain, palpitations, orthopnea and PND.  Gastrointestinal: Negative for abdominal pain, diarrhea, nausea and vomiting.  Genitourinary: Negative for frequency and urgency.  Musculoskeletal: Positive for joint pain. Negative for back pain.  Neurological: Positive for weakness. Negative for sensory change, speech change and focal weakness.  Psychiatric/Behavioral: Negative for depression and hallucinations. The patient is not nervous/anxious.    Tolerating Diet: yes Tolerating PT: SNF  DRUG ALLERGIES:  No Known Allergies  VITALS:  Blood pressure (!) 146/83, pulse (!) 102, temperature 98.6 F (37 C), temperature source Oral, resp. rate 18, height 5\' 5"  (1.651 m), weight 73.7 kg (162 lb 8 oz), SpO2 94 %.  PHYSICAL EXAMINATION:   Physical Exam  GENERAL:  76 y.o.-year-old patient lying in the bed with no acute distress.  EYES: Pupils equal, round, reactive to light and accommodation. No scleral icterus. Extraocular muscles intact.  HEENT: Head atraumatic, normocephalic. Oropharynx and nasopharynx clear.  NECK:  Supple, no jugular venous distention. No thyroid enlargement, no tenderness.  LUNGS: Normal breath sounds bilaterally, no wheezing, rales, rhonchi. No use of accessory muscles of respiration.  CARDIOVASCULAR: S1, S2  normal. No murmurs, rubs, or gallops.  ABDOMEN: Soft, nontender, nondistended. Bowel sounds present. No organomegaly or mass.  EXTREMITIES: No cyanosis, clubbing or edema b/l.    NEUROLOGIC: Cranial nerves II through XII are intact. No focal Motor or sensory deficits b/l.  Right hip pain limited rom PSYCHIATRIC:  patient is alert and oriented x 3.  SKIN: No obvious rash, lesion, or ulcer.   LABORATORY PANEL:  CBC  Recent Labs Lab 01/24/17 0406  WBC 7.4  HGB 11.6*  HCT 34.2*  PLT 189    Chemistries   Recent Labs Lab 01/24/17 0406  NA 137  K 3.9  CL 104  CO2 26  GLUCOSE 106*  BUN 14  CREATININE 0.94  CALCIUM 9.3   Cardiac Enzymes No results for input(s): TROPONINI in the last 168 hours. RADIOLOGY:  Mr Lumbar Spine Wo Contrast  Result Date: 01/24/2017 CLINICAL DATA:  Initial evaluation for intractable right hip pain. EXAM: MRI LUMBAR SPINE WITHOUT CONTRAST TECHNIQUE: Multiplanar, multisequence MR imaging of the lumbar spine was performed. No intravenous contrast was administered. COMPARISON:  None. FINDINGS: Segmentation: Normal segmentation. Lowest well-formed disc labeled the L5-S1 level. Alignment: 5 mm anterolisthesis of L4 on L5. Prominent scoliosis. Mild exaggeration of the normal lumbar lordosis. Vertebrae: Vertebral body heights are maintained. No evidence for acute or chronic fracture. Scattered chronic reactive endplate changes present throughout the lumbar spine extending from T12-L1 through L4-5. Associated chronic degenerative endplate Schmorl's nodes. Benign hemangioma present within the L1 vertebral body. No concerning osseous lesions. No abnormal marrow edema. Signal intensity within the vertebral body bone marrow within normal limits. Conus medullaris: Extends to the L1 level and appears normal. Paraspinal and other soft tissues:  Paraspinous soft tissues within normal limits. Asymmetric chronic atrophy noted within the right psoas muscle. Fatty atrophy noted within  the lower paraspinous musculature is well. Visualized visceral structures within normal limits. Disc levels: T12-L1: Chronic diffuse degenerative disc bulge with intervertebral disc space narrowing and disc desiccation. Chronic reactive endplate changes, greater on the left. No significant canal stenosis. Mild left foraminal narrowing. No significant right foraminal encroachment. L1-2: Chronic diffuse degenerative disc bulge with intervertebral disc space narrowing and disc desiccation. Chronic reactive endplate changes. Facet and ligamentum flavum hypertrophy. Resultant mild canal and left lateral recess stenosis. Mild left L1 foraminal narrowing. No significant right foraminal encroachment. L2-3: Chronic diffuse degenerative disc bulge with intervertebral disc space narrowing and reactive endplate changes. Moderate facet and ligamentum flavum hypertrophy. Resultant mild canal with bilateral lateral recess narrowing, slightly greater on the left. No significant foraminal encroachment bilaterally. L3-4: Chronic diffuse degenerative disc bulge with intervertebral disc space narrowing. Reactive endplate minimal bilateral L3 foraminal narrowing related to degenerative disc bulge, reactive endplate changes, and facet disease. L4-5: Chronic 5 mm anterolisthesis of L4 on L5. Associated broad posterior disc bulge, most prevalent centrally. Moderate to advanced bilateral facet arthrosis. Resultant mild to moderate canal and bilateral subarticular stenosis. Moderate right L4 foraminal narrowing, potentially affecting the exiting right L4 nerve root (series 3, image 9). Mild left foraminal stenosis. L5-S1: Diffuse degenerative disc bulge with disc desiccation. Mild reactive endplate changes. Moderate bilateral facet arthrosis, right worse than left. No significant canal stenosis. Mild bilateral foraminal narrowing, slightly greater on the right. IMPRESSION: 1. Chronic 5 mm anterolisthesis of L4 on L5 with associated disc  bulge and advanced facet arthropathy, resulting in mild-to-moderate canal and bilateral subarticular stenosis moderate right L4 foraminal narrowing. 2. Scoliosis with additional moderate multilevel degenerative spondylolysis and facet arthrosis as above. No other significant canal or foraminal stenosis identified. No other evidence for frank neural impingement. Please see above report for a full description of these findings. Electronically Signed   By: Jeannine Boga M.D.   On: 01/24/2017 01:50   Mr Hip Right Wo Contrast  Result Date: 01/23/2017 CLINICAL DATA:  76 year old with history of arthritis, scheduled for right hip arthroplasty 01/27/2017. Increasing right hip pain with ambulation over the last day. No acute injury. No leukocytosis or reported fever. EXAM: MR OF THE RIGHT HIP WITHOUT CONTRAST TECHNIQUE: Multiplanar, multisequence MR imaging was performed. No intravenous contrast was administered. COMPARISON:  Radiographs 01/23/2017.  No previous studies available. FINDINGS: Bones: In correlation with today's radiographs, there is asymmetric arthropathy involving the right hip joint. There is axial migration of the right femoral head with subchondral cyst formation and edema in the femoral head, neck and acetabulum. There is mild subchondral cyst formation posteriorly in the left femoral head. No specific evidence of avascular necrosis. The visualized sacroiliac joints and symphysis pubis appear normal. Lower lumbar spondylosis noted. Articular cartilage and labrum Articular cartilage: As above, asymmetric right hip arthropathy with axial migration, diffuse chondral thinning, subchondral cyst formation and edema. Labrum: There is no gross labral tear or paralabral abnormality. Joint or bursal effusion Joint effusion: Moderate size right hip joint effusion appears mildly complex with periarticular soft tissue edema. There is a small amount of hip joint fluid on the left. Bursae: No focal bursal  fluid collections. Muscles and tendons Muscles and tendons: There is some T2 hyperintensity within the right hip adductor musculature. The piriformis musculature appears normal. The gluteus, iliopsoas and hamstring tendons appear unremarkable. Other findings Miscellaneous: Sigmoid colon diverticular changes are  noted. Evidence of previous hysterectomy. The visualized internal pelvic contents otherwise appear unremarkable. IMPRESSION: 1. Asymmetric right hip arthropathy with asymmetric complex right hip joint effusion and periarticular edema. 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 2. No evidence of acute fracture, dislocation or avascular necrosis. 3. Mild left hip arthropathy. 4. Lower lumbar spondylosis. Electronically Signed   By: Richardean Sale M.D.   On: 01/23/2017 17:03   Ir Fluoro Guide Ndl Plmt / Bx  Result Date: 01/25/2017 CLINICAL DATA:  Advanced right hip arthritis, preop hip replacement surgery. Right lower extremity radiculopathy. MR demonstrates lumbar scoliosis with multilevel spondylitic changes, most severe L4-5 with 5 mm anterolisthesis, central canal and subarticular stenosis with right foraminal narrowing. EXAM: LUMBAR EPIDURAL INJECTION UNDER FLUOROSCOPY PROCEDURE: The procedure, risks, benefits, and alternatives were explained to the patient. Questions regarding the procedure were encouraged and answered. The patient understands and consents to the procedure. Operator donned sterile gloves and mask. The overlying skin was cleansed and anesthetized. An interlaminar approach was performed on the right at L4-5. A 20 gauge spinal epidural needle was advanced using loss-of-resistance technique. Injection of Omnipaque 180 shows a good epidural pattern with spread above and below the level of needle placement. No intrathecal or vascular opacification is seen. 120mg  of Depo-Medrol mixed with 28ml lidocaine 1% were instilled. The procedure was well-tolerated, and the  patient was discharged thirty minutes following the injection in good condition. FLUOROSCOPY TIME:  0.7 minute, 44 uGym2 DAP COMPLICATIONS: None IMPRESSION: Technically successful epidural injection on the right at L4-5. Electronically Signed   By: Lucrezia Europe M.D.   On: 01/25/2017 13:09   Dg Hip Unilat W Or Wo Pelvis 2-3 Views Right  Result Date: 01/23/2017 CLINICAL DATA:  Chronic right hip pain. EXAM: DG HIP (WITH OR WITHOUT PELVIS) 2-3V RIGHT COMPARISON:  No prior. FINDINGS: Degenerative changes in the lumbar spine and both hips. Sclerotic changes both femoral heads. Avascular necrosis cannot be excluded. Pelvic calcifications noted consistent phleboliths. Peripheral vascular calcification. IMPRESSION: 1. Degenerative changes lumbar spine and both hips. 2. Sclerotic changes both femoral heads. Avascular necrosis cannot be excluded. 3. Peripheral vascular disease . Electronically Signed   By: Marcello Moores  Register   On: 01/23/2017 15:07   ASSESSMENT AND PLAN:  Jakerria Kingbird  is a 76 y.o. female with a known history of Hypertension, hyperlipidemia, chronic arthritis is presenting to the ED with a chief complaint of intractable right hip pain which is chronically getting worse. Patient actually scheduled to get right hip replacement performed on Friday by Dr. Marry Guan.  #Acute on chronic right hip pain secondary to severe osteoarthritis and right hip joint effusion Pain management as needed Consults orthopedics-Dr. Marry Guan noted. -Dr. Marry Guan plans for right hip arthroplasty on Friday Holding aspirin -In the meantime PT will evaluate patient and see if she is able to ambulate. -d/w dr Milta Deiters and pt is s/p right L 4-5 steroid epidural injection on June 20th  #Right hip joint effusion secondary to arthritis Pain management as needed   #Hyperlipidemia continue statin  #Essential hypertension continue home medication metoprolol  Discussed with patient and family  Case discussed with Dr. Marry Guan Case  discussed with Care Management/Social Worker. Management plans discussed with the patient, family and they are in agreement.  CODE STATUS: full  DVT Prophylaxis: SCD  TOTAL TIME TAKING CARE OF THIS PATIENT: *30* minutes.  >50% time spent on counselling and coordination of care  POSSIBLE D/C  DEPENDING ON CLINICAL CONDITION.  Note: This dictation was prepared with  Dragon dictation along with smaller Company secretary. Any transcriptional errors that result from this process are unintentional.  Jailani Hogans M.D on 01/25/2017 at 1:42 PM  Between 7am to 6pm - Pager - [phone]  After 6pm go to [email or web address] - password [email or web address] [facility]  Office  [phone]  CC: Primary care physician; Hortencia Pilar, MD

## 2017-01-25 NOTE — Progress Notes (Signed)
Per [facility] admissions coordinator at [facility] she will start [facility] authorization today. Clinical Social Worker (CSW) will continue to follow and assist as needed.   [employer], LCSW [phone]

## 2017-01-25 NOTE — Care Management (Signed)
Epidural planned for today due to severe pain in back. Dr. Marry Guan to do hip surgery on Friday

## 2017-01-26 ENCOUNTER — Inpatient Hospital Stay: Payer: Medicare HMO

## 2017-01-26 LAB — TYPE AND SCREEN
ABO/RH(D): O POS
ANTIBODY SCREEN: NEGATIVE

## 2017-01-26 MED ORDER — CEFAZOLIN SODIUM-DEXTROSE 2-4 GM/100ML-% IV SOLN
2.0000 g | INTRAVENOUS | Status: AC
  Administered 2017-01-27: 2 g via INTRAVENOUS
  Filled 2017-01-26: qty 100

## 2017-01-26 MED ORDER — TRANEXAMIC ACID 1000 MG/10ML IV SOLN
1.5000 mg/kg/h | INTRAVENOUS | Status: DC
  Filled 2017-01-26: qty 25

## 2017-01-26 NOTE — Progress Notes (Signed)
Subjective :Patient is day 3 admission for pain control of degenerative arthrosis right hip and thigh pain. Patient reports pain may be a little better following her epidural steroid injection yesterday. Continues to rest well when laying in bed. Pain only is precipitated with movement.  Continues to complain of groin and thigh pain.  Objective: Vital signs in last 24 hours: Temp:  [98.5 F (36.9 C)] 98.5 F (36.9 C) (06/20 1349) Pulse Rate:  [93-107] 107 (06/20 1349) Resp:  [17-20] 17 (06/20 1349) BP: (119-163)/(61-96) 119/61 (06/20 1349) SpO2:  [94 %-97 %] 97 % (06/20 1349) Minimal pain noted to palpation to the right thigh and groin region. There is no obvious swelling noted. No evidence of any effusion. No increased warmth redness or any signs of infection. Patient would not allow me to raise the leg. When this was attempted she would automatically tense up right leg. However I was able to logroll the leg and she did not seem to have any discomfort. Neurovascular and neurosensory of the right lower extremity appeared to be intact and within normal limits. Skin was warm and dry. Intake/Output from previous day: 06/20 0701 - 06/21 0700 In: 480 [P.O.:480] Out: 800 [Urine:800] Intake/Output this shift: Total I/O In: -  Out: 800 [Urine:800]   Recent Labs  01/23/17 1405 01/24/17 0406  HGB 12.7 11.6*    Recent Labs  01/23/17 1405 01/24/17 0406  WBC 10.3 7.4  RBC 3.79* 3.48*  HCT 36.9 34.2*  PLT 223 189    Recent Labs  01/23/17 1405 01/24/17 0406  NA 135 137  K 3.5 3.9  CL 104 104  CO2 23 26  BUN 16 14  CREATININE 1.02* 0.94  GLUCOSE 116* 106*  CALCIUM 9.6 9.3   No results for input(s): LABPT, INR in the last 72 hours.  Neurologically intact Neurovascular intact Sensation intact distally Intact pulses distally Dorsiflexion/Plantar flexion intact Compartment soft  Assessment/Plan: Surgery tender plan for Friday morning. Case management to assist with  discharge planning Bowel movement today Labs in am Plan to discharge Sunday or Monday of next week   WOLFE,JON R. 01/26/2017, 7:53 AM

## 2017-01-26 NOTE — Progress Notes (Signed)
[place] at [place] NAME: Aimee Oliver    MR#:  [id number]  DATE OF BIRTH:  [birth year]  SUBJECTIVE:  Came in with significant right hip pain and unable to ambulate at home. Patient states her pain is better. She has been taking some Vicodin. Could not do PT due to severe pain  REVIEW OF SYSTEMS:   Review of Systems  Constitutional: Negative for chills, fever and weight loss.  HENT: Negative for ear discharge, ear pain and nosebleeds.   Eyes: Negative for blurred vision, pain and discharge.  Respiratory: Negative for sputum production, shortness of breath, wheezing and stridor.   Cardiovascular: Negative for chest pain, palpitations, orthopnea and PND.  Gastrointestinal: Negative for abdominal pain, diarrhea, nausea and vomiting.  Genitourinary: Negative for frequency and urgency.  Musculoskeletal: Positive for joint pain. Negative for back pain.  Neurological: Positive for weakness. Negative for sensory change, speech change and focal weakness.  Psychiatric/Behavioral: Negative for depression and hallucinations. The patient is not nervous/anxious.    Tolerating Diet: yes Tolerating PT: SNF  DRUG ALLERGIES:  No Known Allergies  VITALS:  Blood pressure 119/61, pulse (!) 107, temperature 98.5 F (36.9 C), temperature source Oral, resp. rate 17, height 5\' 5"  (1.651 m), weight 73.7 kg (162 lb 8 oz), SpO2 97 %.  PHYSICAL EXAMINATION:   Physical Exam  GENERAL:  76 y.o.-year-old patient lying in the bed with no acute distress.  EYES: Pupils equal, round, reactive to light and accommodation. No scleral icterus. Extraocular muscles intact.  HEENT: Head atraumatic, normocephalic. Oropharynx and nasopharynx clear.  NECK:  Supple, no jugular venous distention. No thyroid enlargement, no tenderness.  LUNGS: Normal breath sounds bilaterally, no wheezing, rales, rhonchi. No use of accessory muscles of respiration.  CARDIOVASCULAR: S1, S2  normal. No murmurs, rubs, or gallops.  ABDOMEN: Soft, nontender, nondistended. Bowel sounds present. No organomegaly or mass.  EXTREMITIES: No cyanosis, clubbing or edema b/l.    NEUROLOGIC: Cranial nerves II through XII are intact. No focal Motor or sensory deficits b/l.  Right hip pain limited rom PSYCHIATRIC:  patient is alert and oriented x 3.  SKIN: No obvious rash, lesion, or ulcer.   LABORATORY PANEL:  CBC  Recent Labs Lab 01/24/17 0406  WBC 7.4  HGB 11.6*  HCT 34.2*  PLT 189    Chemistries   Recent Labs Lab 01/24/17 0406  NA 137  K 3.9  CL 104  CO2 26  GLUCOSE 106*  BUN 14  CREATININE 0.94  CALCIUM 9.3   Cardiac Enzymes No results for input(s): TROPONINI in the last 168 hours. RADIOLOGY:  Ir Fluoro Guide Ndl Plmt / Bx  Result Date: 01/25/2017 CLINICAL DATA:  Advanced right hip arthritis, preop hip replacement surgery. Right lower extremity radiculopathy. MR demonstrates lumbar scoliosis with multilevel spondylitic changes, most severe L4-5 with 5 mm anterolisthesis, central canal and subarticular stenosis with right foraminal narrowing. EXAM: LUMBAR EPIDURAL INJECTION UNDER FLUOROSCOPY PROCEDURE: The procedure, risks, benefits, and alternatives were explained to the patient. Questions regarding the procedure were encouraged and answered. The patient understands and consents to the procedure. Operator donned sterile gloves and mask. The overlying skin was cleansed and anesthetized. An interlaminar approach was performed on the right at L4-5. A 20 gauge spinal epidural needle was advanced using loss-of-resistance technique. Injection of Omnipaque 180 shows a good epidural pattern with spread above and below the level of needle placement. No intrathecal or vascular opacification is seen. 120mg  of Depo-Medrol mixed with  7ml lidocaine 1% were instilled. The procedure was well-tolerated, and the patient was discharged thirty minutes following the injection in good condition.  FLUOROSCOPY TIME:  0.7 minute, 44 uGym2 DAP COMPLICATIONS: None IMPRESSION: Technically successful epidural injection on the right at L4-5. Electronically Signed   By: Lucrezia Europe M.D.   On: 01/25/2017 13:09   Dg Femur, Min 2 Views Right  Result Date: 01/26/2017 CLINICAL DATA:  Thigh pain. EXAM: RIGHT FEMUR 2 VIEWS COMPARISON:  01/23/2017 FINDINGS: There is advanced degenerative changes identified involving the right hip joint. Previous right knee arthroplasty. There is no acute fracture or subluxation identified. IMPRESSION: 1. Advanced right hip osteoarthritis. 2. Previous right knee arthroplasty identified. Electronically Signed   By: Kerby Moors M.D.   On: 01/26/2017 10:08   ASSESSMENT AND PLAN:  Briseida Gittings  is a 76 y.o. female with a known history of Hypertension, hyperlipidemia, chronic arthritis is presenting to the ED with a chief complaint of intractable right hip pain which is chronically getting worse. Patient actually scheduled to get right hip replacement performed on Friday by Dr. Marry Guan.  #Acute on chronic right hip pain secondary to severe osteoarthritis and right hip joint effusion Pain management as needed Consults orthopedics-Dr. Marry Guan noted. -Dr. Marry Guan plans for right hip arthroplasty on Friday Holding aspirin -In the meantime PT will evaluate patient and see if she is able to ambulate. -d/w dr Milta Deiters and pt is s/p right L 4-5 steroid epidural injection on June 20th  #Right hip joint effusion secondary to severe degenerative arthritis Pain management as needed   #Hyperlipidemia continue statin  #Essential hypertension continue home medication metoprolol  Discussed with patient and husband  Case discussed with Dr. Marry Guan Case discussed with Care Management/Social Worker. Management plans discussed with the patient, family and they are in agreement.  CODE STATUS: full  DVT Prophylaxis: SCD  TOTAL TIME TAKING CARE OF THIS PATIENT: 25 minutes.  >50% time  spent on counselling and coordination of care  POSSIBLE D/C  DEPENDING ON CLINICAL CONDITION.  Note: This dictation was prepared with Dragon dictation along with smaller phrase technology. Any transcriptional errors that result from this process are unintentional.  Ettie Krontz M.D on 01/26/2017 at 10:21 AM  Between 7am to 6pm - Pager - [phone]  After 6pm go to [email or web address] - password [email or web address] [facility]  Office  [phone]  CC: Primary care physician; Hortencia Pilar, MD

## 2017-01-26 NOTE — Care Management CC44 (Signed)
Condition Code 44 Documentation Completed  Patient Details  Name: Aimee Oliver MRN: [id number] Date of Birth: 1941/05/14   Condition Code 44 given:  Yes Patient signature on Condition Code 44 notice:  Yes Documentation of 2 MD's agreement:  Yes Code 44 added to claim:  Yes    Jolly Mango, RN 01/26/2017, 2:22 PM

## 2017-01-26 NOTE — Care Management Obs Status (Signed)
MEDICARE OBSERVATION STATUS NOTIFICATION   Patient Details  Name: Aimee Oliver MRN: [id number] Date of Birth: Feb 05, 1941   Medicare Observation Status Notification Given:  Yes    Jolly Mango, RN 01/26/2017, 2:22 PM

## 2017-01-26 NOTE — Progress Notes (Signed)
Physical Therapy Treatment Patient Details Name: Aimee Oliver MRN: [id number] DOB: [birth year] Today's Date: 01/26/2017    History of Present Illness 76 y.o. female with a known history of Hypertension, hyperlipidemia, chronic arthritis is presenting with a chief complaint of intractable right hip pain which is chronically getting worse. Patient actually scheduled to get right hip replacement performed on Friday, possibility of lumbar issues has complicated the situation.      PT Comments    Pt in bed awaiting transport to x-ray.  Had received pain medication prior.  Participated in exercises as described below.  Session limited by pain.  OOB activities deferred due to pain.  Surgery scheduled for AM.  Follow Up Recommendations  SNF     Equipment Recommendations  Rolling walker with 5" wheels    Recommendations for Other Services       Precautions / Restrictions Precautions Precautions: Fall Restrictions Weight Bearing Restrictions: No    Mobility  Bed Mobility               General bed mobility comments: did not attempt  Transfers                    Ambulation/Gait                 Stairs            Wheelchair Mobility    Modified Rankin (Stroke Patients Only)       Balance                                            Cognition Arousal/Alertness: Awake/alert Behavior During Therapy: WFL for tasks assessed/performed Overall Cognitive Status: Within Functional Limits for tasks assessed                                        Exercises Other Exercises Other Exercises: Isometric quad and glut sets, SAQ and ankle pumps 3 x 10    General Comments        Pertinent Vitals/Pain Pain Assessment: 0-10 Pain Score: 6  Pain Location: R groin, thigh and minimally down into lower leg Pain Descriptors / Indicators: Constant;Discomfort;Tender Pain Intervention(s): Limited activity within patient's tolerance     Home Living                      Prior Function            PT Goals (current goals can now be found in the care plan section) Progress towards PT goals: Progressing toward goals    Frequency    Min 2X/week      PT Plan Current plan remains appropriate    Co-evaluation              AM-PAC PT "6 Clicks" Daily Activity  Outcome Measure  Difficulty turning over in bed (including adjusting bedclothes, sheets and blankets)?: Total Difficulty moving from lying on back to sitting on the side of the bed? : Total Difficulty sitting down on and standing up from a chair with arms (e.g., wheelchair, bedside commode, etc,.)?: Total Help needed moving to and from a bed to chair (including a wheelchair)?: Total Help needed walking in hospital room?: Total Help needed climbing 3-5 steps with a railing? : Total  6 Click Score: 6    End of Session   Activity Tolerance: Patient limited by pain Patient left: in bed;with bed alarm set;with call bell/phone within reach;with nursing/sitter in room   Pain - Right/Left: Right Pain - part of body: Hip     Time: 0228-4069 PT Time Calculation (min) (ACUTE ONLY): 17 min  Charges:  $Therapeutic Exercise: 8-22 mins                    G Codes:       Chesley Noon, PTA 01/26/17, 9:13 AM '

## 2017-01-27 ENCOUNTER — Encounter
Admission: EM | Disposition: A | Discharge status: Home Health | Payer: Self-pay | Source: Home / Self Care | Attending: Internal Medicine

## 2017-01-27 ENCOUNTER — Observation Stay: Payer: Medicare HMO | Admitting: Anesthesiology

## 2017-01-27 ENCOUNTER — Inpatient Hospital Stay: Payer: Medicare HMO

## 2017-01-27 ENCOUNTER — Inpatient Hospital Stay: Admission: RE | Admit: 2017-01-27 | Payer: Medicare HMO | Source: Ambulatory Visit | Admitting: Orthopedic Surgery

## 2017-01-27 ENCOUNTER — Encounter: Payer: Self-pay | Admitting: Anesthesiology

## 2017-01-27 DIAGNOSIS — R531 Weakness: Secondary | ICD-10-CM | POA: Diagnosis present

## 2017-01-27 DIAGNOSIS — I10 Essential (primary) hypertension: Secondary | ICD-10-CM | POA: Diagnosis present

## 2017-01-27 DIAGNOSIS — Z96651 Presence of right artificial knee joint: Secondary | ICD-10-CM | POA: Diagnosis present

## 2017-01-27 DIAGNOSIS — M25551 Pain in right hip: Secondary | ICD-10-CM | POA: Diagnosis present

## 2017-01-27 DIAGNOSIS — E785 Hyperlipidemia, unspecified: Secondary | ICD-10-CM | POA: Diagnosis present

## 2017-01-27 DIAGNOSIS — M879 Osteonecrosis, unspecified: Secondary | ICD-10-CM | POA: Diagnosis present

## 2017-01-27 DIAGNOSIS — K219 Gastro-esophageal reflux disease without esophagitis: Secondary | ICD-10-CM | POA: Diagnosis present

## 2017-01-27 DIAGNOSIS — Z96649 Presence of unspecified artificial hip joint: Secondary | ICD-10-CM

## 2017-01-27 DIAGNOSIS — M1611 Unilateral primary osteoarthritis, right hip: Secondary | ICD-10-CM | POA: Diagnosis present

## 2017-01-27 DIAGNOSIS — G8929 Other chronic pain: Secondary | ICD-10-CM | POA: Diagnosis present

## 2017-01-27 DIAGNOSIS — D333 Benign neoplasm of cranial nerves: Secondary | ICD-10-CM | POA: Diagnosis present

## 2017-01-27 DIAGNOSIS — K5900 Constipation, unspecified: Secondary | ICD-10-CM | POA: Diagnosis present

## 2017-01-27 DIAGNOSIS — Z79899 Other long term (current) drug therapy: Secondary | ICD-10-CM | POA: Diagnosis not present

## 2017-01-27 DIAGNOSIS — Z90710 Acquired absence of both cervix and uterus: Secondary | ICD-10-CM | POA: Diagnosis not present

## 2017-01-27 DIAGNOSIS — Z9849 Cataract extraction status, unspecified eye: Secondary | ICD-10-CM | POA: Diagnosis not present

## 2017-01-27 DIAGNOSIS — Z9882 Breast implant status: Secondary | ICD-10-CM | POA: Diagnosis not present

## 2017-01-27 DIAGNOSIS — M5116 Intervertebral disc disorders with radiculopathy, lumbar region: Secondary | ICD-10-CM | POA: Diagnosis present

## 2017-01-27 DIAGNOSIS — E7800 Pure hypercholesterolemia, unspecified: Secondary | ICD-10-CM | POA: Diagnosis present

## 2017-01-27 DIAGNOSIS — Z7982 Long term (current) use of aspirin: Secondary | ICD-10-CM | POA: Diagnosis not present

## 2017-01-27 DIAGNOSIS — M48061 Spinal stenosis, lumbar region without neurogenic claudication: Secondary | ICD-10-CM | POA: Diagnosis present

## 2017-01-27 DIAGNOSIS — M4726 Other spondylosis with radiculopathy, lumbar region: Secondary | ICD-10-CM | POA: Diagnosis present

## 2017-01-27 DIAGNOSIS — I739 Peripheral vascular disease, unspecified: Secondary | ICD-10-CM | POA: Diagnosis present

## 2017-01-27 HISTORY — PX: TOTAL HIP ARTHROPLASTY: SHX124

## 2017-01-27 SURGERY — ARTHROPLASTY, HIP, TOTAL,POSTERIOR APPROACH
Anesthesia: Spinal | Site: Hip | Laterality: Right | Wound class: Clean

## 2017-01-27 MED ORDER — FENTANYL CITRATE (PF) 100 MCG/2ML IJ SOLN
INTRAMUSCULAR | Status: DC | PRN
  Administered 2017-01-27: 50 ug via INTRAVENOUS
  Administered 2017-01-27 (×2): 25 ug via INTRAVENOUS

## 2017-01-27 MED ORDER — FERROUS SULFATE 325 (65 FE) MG PO TABS: 325.0000 mg | ORAL_TABLET | Freq: Two times a day (BID) | ORAL | 3 refills | Status: DC

## 2017-01-27 MED ORDER — VITAMIN C 500 MG PO TABS
500.0000 mg | ORAL_TABLET | Freq: Every day | ORAL | Status: DC
  Administered 2017-01-27 – 2017-01-29 (×3): 500 mg via ORAL
  Filled 2017-01-27 (×3): qty 1

## 2017-01-27 MED ORDER — CEFAZOLIN SODIUM-DEXTROSE 2-4 GM/100ML-% IV SOLN
2.0000 g | Freq: Four times a day (QID) | INTRAVENOUS | Status: AC
  Administered 2017-01-27 – 2017-01-28 (×4): 2 g via INTRAVENOUS
  Filled 2017-01-27 (×4): qty 100

## 2017-01-27 MED ORDER — MIDAZOLAM HCL 5 MG/5ML IJ SOLN
INTRAMUSCULAR | Status: DC | PRN
  Administered 2017-01-27: 2 mg via INTRAVENOUS

## 2017-01-27 MED ORDER — CELECOXIB 200 MG PO CAPS
200.0000 mg | ORAL_CAPSULE | Freq: Two times a day (BID) | ORAL | Status: DC
  Administered 2017-01-27 – 2017-01-29 (×4): 200 mg via ORAL
  Filled 2017-01-27 (×4): qty 1

## 2017-01-27 MED ORDER — FERROUS SULFATE 325 (65 FE) MG PO TABS
325.0000 mg | ORAL_TABLET | Freq: Two times a day (BID) | ORAL | Status: DC
  Administered 2017-01-27 – 2017-01-29 (×4): 325 mg via ORAL
  Filled 2017-01-27 (×4): qty 1

## 2017-01-27 MED ORDER — FLEET ENEMA 7-19 GM/118ML RE ENEM
1.0000 | ENEMA | Freq: Once | RECTAL | Status: AC | PRN
  Administered 2017-01-29: 1 via RECTAL

## 2017-01-27 MED ORDER — METOCLOPRAMIDE HCL 10 MG PO TABS
10.0000 mg | ORAL_TABLET | Freq: Three times a day (TID) | ORAL | Status: DC
  Administered 2017-01-27 – 2017-01-29 (×7): 10 mg via ORAL
  Filled 2017-01-27 (×7): qty 1

## 2017-01-27 MED ORDER — ACETAMINOPHEN 650 MG RE SUPP: 650.0000 mg | Freq: Four times a day (QID) | RECTAL | Status: DC | PRN

## 2017-01-27 MED ORDER — HYDRALAZINE HCL 20 MG/ML IJ SOLN
INTRAMUSCULAR | Status: AC
Start: 2017-01-27 — End: 2017-01-27
  Administered 2017-01-27: 10 mg via INTRAVENOUS
  Filled 2017-01-27: qty 1

## 2017-01-27 MED ORDER — HYDROMORPHONE HCL 1 MG/ML IJ SOLN
INTRAMUSCULAR | Status: AC
  Administered 2017-01-27: 0.5 mg via INTRAVENOUS
  Filled 2017-01-27: qty 1

## 2017-01-27 MED ORDER — HYDROMORPHONE HCL 1 MG/ML IJ SOLN
0.2500 mg | INTRAMUSCULAR | Status: DC | PRN
  Administered 2017-01-27 (×4): 0.5 mg via INTRAVENOUS

## 2017-01-27 MED ORDER — FENTANYL CITRATE (PF) 100 MCG/2ML IJ SOLN
INTRAMUSCULAR | Status: AC
  Administered 2017-01-27: 25 ug via INTRAVENOUS
  Filled 2017-01-27: qty 2

## 2017-01-27 MED ORDER — ONDANSETRON HCL 4 MG/2ML IJ SOLN: 4.0000 mg | Freq: Four times a day (QID) | INTRAMUSCULAR | Status: DC | PRN

## 2017-01-27 MED ORDER — ALUM & MAG HYDROXIDE-SIMETH 200-200-20 MG/5ML PO SUSP: 30.0000 mL | ORAL | Status: DC | PRN

## 2017-01-27 MED ORDER — DIPHENHYDRAMINE HCL 12.5 MG/5ML PO ELIX
12.5000 mg | ORAL_SOLUTION | ORAL | Status: DC | PRN
Start: 2017-01-27 — End: 2017-01-29

## 2017-01-27 MED ORDER — ENOXAPARIN SODIUM 30 MG/0.3ML ~~LOC~~ SOLN
30.0000 mg | Freq: Two times a day (BID) | SUBCUTANEOUS | Status: DC
  Administered 2017-01-28 – 2017-01-29 (×3): 30 mg via SUBCUTANEOUS
  Filled 2017-01-27 (×3): qty 0.3

## 2017-01-27 MED ORDER — CELECOXIB 200 MG PO CAPS: 200.0000 mg | ORAL_CAPSULE | Freq: Two times a day (BID) | ORAL | 1 refills | Status: DC

## 2017-01-27 MED ORDER — BUPIVACAINE HCL (PF) 0.5 % IJ SOLN
INTRAMUSCULAR | Status: DC | PRN
  Administered 2017-01-27: 2.5 mL

## 2017-01-27 MED ORDER — FENTANYL CITRATE (PF) 100 MCG/2ML IJ SOLN
25.0000 ug | INTRAMUSCULAR | Status: AC | PRN
  Administered 2017-01-27 (×6): 25 ug via INTRAVENOUS

## 2017-01-27 MED ORDER — BISACODYL 10 MG RE SUPP: 10.0000 mg | Freq: Every day | RECTAL | Status: DC | PRN

## 2017-01-27 MED ORDER — NEOMYCIN-POLYMYXIN B GU 40-200000 IR SOLN
Status: DC | PRN
  Administered 2017-01-27: 14 mL

## 2017-01-27 MED ORDER — ONDANSETRON HCL 4 MG/2ML IJ SOLN: 4.0000 mg | Freq: Once | INTRAMUSCULAR | Status: DC | PRN

## 2017-01-27 MED ORDER — HYDRALAZINE HCL 20 MG/ML IJ SOLN
10.0000 mg | Freq: Four times a day (QID) | INTRAMUSCULAR | Status: DC | PRN
  Administered 2017-01-27: 10 mg via INTRAVENOUS
  Filled 2017-01-27: qty 0.5

## 2017-01-27 MED ORDER — DEXAMETHASONE SODIUM PHOSPHATE 4 MG/ML IJ SOLN
INTRAMUSCULAR | Status: DC | PRN
  Administered 2017-01-27: 10 mg via INTRAVENOUS

## 2017-01-27 MED ORDER — KETAMINE HCL 50 MG/ML IJ SOLN
INTRAMUSCULAR | Status: DC | PRN
  Administered 2017-01-27 (×3): 25 mg via INTRAMUSCULAR

## 2017-01-27 MED ORDER — PHENOL 1.4 % MT LIQD
1.0000 | OROMUCOSAL | Status: DC | PRN
  Filled 2017-01-27: qty 177

## 2017-01-27 MED ORDER — SENNOSIDES-DOCUSATE SODIUM 8.6-50 MG PO TABS
1.0000 | ORAL_TABLET | Freq: Two times a day (BID) | ORAL | Status: DC
  Administered 2017-01-27 – 2017-01-29 (×4): 1 via ORAL
  Filled 2017-01-27 (×4): qty 1

## 2017-01-27 MED ORDER — MAGNESIUM HYDROXIDE 400 MG/5ML PO SUSP
30.0000 mL | Freq: Every day | ORAL | Status: DC | PRN
  Administered 2017-01-28: 30 mL via ORAL
  Filled 2017-01-27: qty 30

## 2017-01-27 MED ORDER — ACETAMINOPHEN 10 MG/ML IV SOLN
1000.0000 mg | Freq: Four times a day (QID) | INTRAVENOUS | Status: AC
  Administered 2017-01-27 – 2017-01-28 (×4): 1000 mg via INTRAVENOUS
  Filled 2017-01-27 (×4): qty 100

## 2017-01-27 MED ORDER — MENTHOL 3 MG MT LOZG
1.0000 | LOZENGE | OROMUCOSAL | Status: DC | PRN
  Filled 2017-01-27: qty 9

## 2017-01-27 MED ORDER — FAMOTIDINE 20 MG PO TABS
20.0000 mg | ORAL_TABLET | Freq: Once | ORAL | Status: AC
  Administered 2017-01-27: 20 mg via ORAL

## 2017-01-27 MED ORDER — ACETAMINOPHEN 10 MG/ML IV SOLN
INTRAVENOUS | Status: DC | PRN
  Administered 2017-01-27: 1000 mg via INTRAVENOUS

## 2017-01-27 MED ORDER — ACETAMINOPHEN 325 MG PO TABS: 650.0000 mg | ORAL_TABLET | Freq: Four times a day (QID) | ORAL | Status: DC | PRN

## 2017-01-27 MED ORDER — PANTOPRAZOLE SODIUM 40 MG PO TBEC
40.0000 mg | DELAYED_RELEASE_TABLET | Freq: Two times a day (BID) | ORAL | Status: DC
  Administered 2017-01-27 – 2017-01-29 (×4): 40 mg via ORAL
  Filled 2017-01-27 (×4): qty 1

## 2017-01-27 MED ORDER — OXYCODONE HCL 5 MG PO TABS
5.0000 mg | ORAL_TABLET | ORAL | Status: DC | PRN
  Administered 2017-01-27: 5 mg via ORAL
  Administered 2017-01-27 – 2017-01-28 (×2): 10 mg via ORAL
  Filled 2017-01-27: qty 1
  Filled 2017-01-27 (×2): qty 2

## 2017-01-27 MED ORDER — SODIUM CHLORIDE 0.9 % IV SOLN
INTRAVENOUS | Status: DC
  Administered 2017-01-27: 13:00:00 via INTRAVENOUS
  Administered 2017-01-28: 100 mL/h via INTRAVENOUS

## 2017-01-27 MED ORDER — PROPOFOL 500 MG/50ML IV EMUL
INTRAVENOUS | Status: DC | PRN
Start: 2017-01-27 — End: 2017-01-27
  Administered 2017-01-27: 100 ug/kg/min via INTRAVENOUS

## 2017-01-27 MED ORDER — TETRACAINE HCL 1 % IJ SOLN
INTRAMUSCULAR | Status: DC | PRN
  Administered 2017-01-27: 5 mg via INTRASPINAL

## 2017-01-27 MED ORDER — PROPOFOL 10 MG/ML IV BOLUS
INTRAVENOUS | Status: DC | PRN
  Administered 2017-01-27 (×2): 20 mg via INTRAVENOUS

## 2017-01-27 MED ORDER — LACTATED RINGERS IV SOLN
INTRAVENOUS | Status: DC | PRN
  Administered 2017-01-27 (×2): via INTRAVENOUS

## 2017-01-27 MED ORDER — ENOXAPARIN SODIUM 30 MG/0.3ML ~~LOC~~ SOLN: 30.0000 mg | Freq: Two times a day (BID) | SUBCUTANEOUS | 0 refills | Status: DC

## 2017-01-27 MED ORDER — PHENYLEPHRINE HCL 10 MG/ML IJ SOLN
INTRAMUSCULAR | Status: DC | PRN
  Administered 2017-01-27: 25 ug/min via INTRAVENOUS

## 2017-01-27 MED ORDER — TRANEXAMIC ACID 1000 MG/10ML IV SOLN
1000.0000 mg | Freq: Once | INTRAVENOUS | Status: AC
  Administered 2017-01-27: 1000 mg via INTRAVENOUS
  Filled 2017-01-27: qty 10

## 2017-01-27 MED ORDER — OXYCODONE HCL 5 MG PO TABS: 5.0000 mg | ORAL_TABLET | ORAL | 0 refills | Status: DC | PRN

## 2017-01-27 MED ORDER — ONDANSETRON HCL 4 MG PO TABS
4.0000 mg | ORAL_TABLET | Freq: Four times a day (QID) | ORAL | Status: DC | PRN
  Filled 2017-01-27: qty 1

## 2017-01-27 MED ORDER — MORPHINE SULFATE (PF) 2 MG/ML IV SOLN: 2.0000 mg | INTRAVENOUS | Status: DC | PRN

## 2017-01-27 MED ORDER — TRANEXAMIC ACID 1000 MG/10ML IV SOLN
INTRAVENOUS | Status: DC | PRN
  Administered 2017-01-27: 1000 mg via INTRAVENOUS

## 2017-01-27 MED ORDER — TRAMADOL HCL 50 MG PO TABS: 50.0000 mg | ORAL_TABLET | ORAL | Status: DC | PRN

## 2017-01-27 SURGICAL SUPPLY — 52 items
BLADE DRUM FLTD (BLADE) ×3 IMPLANT
BLADE SAW 1 (BLADE) ×3 IMPLANT
CANISTER SUCT 1200ML W/VALVE (MISCELLANEOUS) ×3 IMPLANT
CANISTER SUCT 3000ML PPV (MISCELLANEOUS) ×6 IMPLANT
CAPT HIP TOTAL 2 ×3 IMPLANT
CARTRIDGE OIL MAESTRO DRILL (MISCELLANEOUS) ×1 IMPLANT
CATH FOL LEG HOLDER (MISCELLANEOUS) ×3 IMPLANT
CATH TRAY METER 16FR LF (MISCELLANEOUS) ×3 IMPLANT
DIFFUSER MAESTRO (MISCELLANEOUS) ×3 IMPLANT
DRAPE INCISE IOBAN 66X60 STRL (DRAPES) ×3 IMPLANT
DRAPE SHEET LG 3/4 BI-LAMINATE (DRAPES) ×3 IMPLANT
DRSG DERMACEA 8X12 NADH (GAUZE/BANDAGES/DRESSINGS) ×3 IMPLANT
DRSG OPSITE POSTOP 4X12 (GAUZE/BANDAGES/DRESSINGS) IMPLANT
DRSG OPSITE POSTOP 4X14 (GAUZE/BANDAGES/DRESSINGS) ×3 IMPLANT
DRSG TEGADERM 4X4.75 (GAUZE/BANDAGES/DRESSINGS) ×3 IMPLANT
DURAPREP 26ML APPLICATOR (WOUND CARE) ×3 IMPLANT
ELECT BLADE 6.5 EXT (BLADE) ×3 IMPLANT
ELECT CAUTERY BLADE 6.4 (BLADE) ×3 IMPLANT
GLOVE BIOGEL M STRL SZ7.5 (GLOVE) ×6 IMPLANT
GLOVE BIOGEL PI IND STRL 9 (GLOVE) ×1 IMPLANT
GLOVE BIOGEL PI INDICATOR 9 (GLOVE) ×2
GLOVE INDICATOR 8.0 STRL GRN (GLOVE) ×3 IMPLANT
GLOVE SURG SYN 9.0  PF PI (GLOVE) ×2
GLOVE SURG SYN 9.0 PF PI (GLOVE) ×1 IMPLANT
GOWN STRL REUS W/ TWL LRG LVL3 (GOWN DISPOSABLE) ×2 IMPLANT
GOWN STRL REUS W/TWL 2XL LVL3 (GOWN DISPOSABLE) ×3 IMPLANT
GOWN STRL REUS W/TWL LRG LVL3 (GOWN DISPOSABLE) ×4
HEMOVAC 400CC 10FR (MISCELLANEOUS) ×3 IMPLANT
HOOD PEEL AWAY FLYTE STAYCOOL (MISCELLANEOUS) ×6 IMPLANT
KIT RM TURNOVER STRD PROC AR (KITS) ×3 IMPLANT
NDL SAFETY 18GX1.5 (NEEDLE) ×3 IMPLANT
NS IRRIG 500ML POUR BTL (IV SOLUTION) ×3 IMPLANT
OIL CARTRIDGE MAESTRO DRILL (MISCELLANEOUS) ×3
PACK HIP PROSTHESIS (MISCELLANEOUS) ×3 IMPLANT
PIN STEIN THRED 5/32 (Pin) ×3 IMPLANT
PULSAVAC PLUS IRRIG FAN TIP (DISPOSABLE) ×3
SOL .9 NS 3000ML IRR  AL (IV SOLUTION) ×2
SOL .9 NS 3000ML IRR UROMATIC (IV SOLUTION) ×1 IMPLANT
SOL PREP PVP 2OZ (MISCELLANEOUS) ×3
SOLUTION PREP PVP 2OZ (MISCELLANEOUS) ×1 IMPLANT
SPONGE DRAIN TRACH 4X4 STRL 2S (GAUZE/BANDAGES/DRESSINGS) ×3 IMPLANT
STAPLER SKIN PROX 35W (STAPLE) ×3 IMPLANT
SUT ETHIBOND #5 BRAIDED 30INL (SUTURE) ×3 IMPLANT
SUT VIC AB 0 CT1 36 (SUTURE) ×3 IMPLANT
SUT VIC AB 1 CT1 36 (SUTURE) ×6 IMPLANT
SUT VIC AB 2-0 CT1 27 (SUTURE) ×2
SUT VIC AB 2-0 CT1 TAPERPNT 27 (SUTURE) ×1 IMPLANT
SYR 20CC LL (SYRINGE) ×3 IMPLANT
TAPE ADH 3 LX (MISCELLANEOUS) ×3 IMPLANT
TAPE TRANSPORE STRL 2 31045 (GAUZE/BANDAGES/DRESSINGS) ×3 IMPLANT
TIP FAN IRRIG PULSAVAC PLUS (DISPOSABLE) ×1 IMPLANT
TOWEL OR 17X26 4PK STRL BLUE (TOWEL DISPOSABLE) ×3 IMPLANT

## 2017-01-27 NOTE — Evaluation (Signed)
Physical Therapy ReEvaluation Patient Details Name: Aimee Oliver MRN: [id number] DOB: 1941-03-09 Today's Date: 01/27/2017   History of Present Illness  76 y.o. female with a known history of Hypertension, hyperlipidemia, chronic arthritis is presenting with a chief complaint of intractable right hip pain which is chronically getting worse. She did have right hip replacement as scheduled AM of 6/22. Possibility of lumbar issues has complicated the situation.    Clinical Impression  Pt in room after R posterior total hip replacement this AM.  She is "still a little loopy" and pleasantly talkative and willing to participate but still has some numbness in R LE And and is cognitively not back to baseline.  She was able to tolerate a lot of supine bed exercises and though she had some expected post-op pain she was no where near her severely pain limited levels pre-surgery.  Pt hoping to go home but knows she needs to show a lot more mobility and safety than she has been able to show for the last few days. Pt showed good quad control, strength and generally showed good effort and willingness to participate.     Follow Up Recommendations SNF    Equipment Recommendations  Rolling walker with 5" wheels    Recommendations for Other Services       Precautions / Restrictions Precautions Precautions: Fall;Posterior Hip Precaution Booklet Issued: Yes (comment) Restrictions Weight Bearing Restrictions: No      Mobility  Bed Mobility               General bed mobility comments: deferred mobility secondary to continued numbness in R LE, nursing to dangle legs later  Transfers                    Ambulation/Gait                Stairs            Wheelchair Mobility    Modified Rankin (Stroke Patients Only)       Balance                                             Pertinent Vitals/Pain Pain Assessment: 0-10 Pain Score: 4  Pain Location: Pt  still with some low grad numbness in R LE, but did report expected post-op pain with movement    Home Living Family/patient expects to be discharged to:: Private residence Living Arrangements: Spouse/significant other Available Help at Discharge: Family;Friend(s)   Home Access: Stairs to enter Entrance Stairs-Rails:  (post) Entrance Stairs-Number of Steps: 2   Home Equipment: Cane - single point      Prior Function Level of Independence: Independent         Comments: Until ~1 week ago pt would have hip issues, but could do basic activities and some prolonged walking, pain has become so severe as to be unable to walk the last 2 days     Hand Dominance        Extremity/Trunk Assessment   Upper Extremity Assessment Upper Extremity Assessment: Overall WFL for tasks assessed    Lower Extremity Assessment RLE Deficits / Details: 4/5 t/o b/l except R hip 3-/5, much more tolerant of all mvt       Communication   Communication: No difficulties  Cognition Arousal/Alertness: Awake/alert Behavior During Therapy: WFL for tasks assessed/performed Overall Cognitive Status: Within  Functional Limits for tasks assessed                                 General Comments: Pt states "I'm still a little loopy from the surgery"      General Comments      Exercises Total Joint Exercises Ankle Circles/Pumps: Strengthening;10 reps Quad Sets: Strengthening;10 reps Gluteal Sets: Strengthening;10 reps Short Arc Quad: Strengthening;10 reps Heel Slides: Strengthening;5 reps Hip ABduction/ADduction: Strengthening;10 reps Straight Leg Raises: AAROM;5 reps   Assessment/Plan    PT Assessment Patient needs continued PT services  PT Problem List Decreased strength;Decreased range of motion;Decreased activity tolerance;Decreased balance;Decreased mobility;Decreased coordination;Decreased knowledge of use of DME;Pain;Decreased safety awareness       PT Treatment Interventions  DME instruction;Gait training;Stair training;Functional mobility training;Therapeutic activities;Therapeutic exercise;Balance training;Patient/family education    PT Goals (Current goals can be found in the Care Plan section)  Acute Rehab PT Goals Patient Stated Goal: go home PT Goal Formulation: With patient Time For Goal Achievement: 02/10/17 Potential to Achieve Goals: Fair    Frequency BID   Barriers to discharge        Co-evaluation               AM-PAC PT "6 Clicks" Daily Activity  Outcome Measure Difficulty turning over in bed (including adjusting bedclothes, sheets and blankets)?: Total Difficulty moving from lying on back to sitting on the side of the bed? : Total Difficulty sitting down on and standing up from a chair with arms (e.g., wheelchair, bedside commode, etc,.)?: Total Help needed moving to and from a bed to chair (including a wheelchair)?: Total Help needed walking in hospital room?: Total Help needed climbing 3-5 steps with a railing? : Total 6 Click Score: 6    End of Session Equipment Utilized During Treatment: Oxygen (2 liters, sats high 90s t/o) Activity Tolerance: Patient limited by pain Patient left: in bed;with bed alarm set;with call bell/phone within reach;with nursing/sitter in room   PT Visit Diagnosis: Muscle weakness (generalized) (M62.81);Difficulty in walking, not elsewhere classified (R26.2)    Time: 1540-1610 PT Time Calculation (min) (ACUTE ONLY): 30 min   Charges:   PT Evaluation $PT Re-evaluation: 1 Procedure PT Treatments $Therapeutic Exercise: 8-22 mins   PT G Codes:        Kreg Shropshire, DPT 01/27/2017, 4:48 PM

## 2017-01-27 NOTE — Progress Notes (Addendum)
Plan is for patient to D/C to [facility] Sunday 01/29/17 if patient still meets criteria for SNF per PT. Clinical Education officer, museum (CSW) sent D/C summary to [facility] today via [employer]. Per [facility] admissions coordinator at [facility] patient can come to room 412 and [facility] authorization was started and she will accept a 5 day LOG. CSW [place] approved 5 day LOG. Patient is aware of above. CSW will continue to follow and assist as needed.   Patient's friend/ caregiver Shanon Brow is aware of above.   [employer], LCSW [phone]

## 2017-01-27 NOTE — Progress Notes (Signed)
[place] at [place] NAME: Aimee Oliver    MR#:  [id number]  DATE OF BIRTH:  1940-08-26  SUBJECTIVE:   Seen in recovery. Tearful with pain REVIEW OF SYSTEMS:   Review of Systems  Constitutional: Negative for chills, fever and weight loss.  HENT: Negative for ear discharge, ear pain and nosebleeds.   Eyes: Negative for blurred vision, pain and discharge.  Respiratory: Negative for sputum production, shortness of breath, wheezing and stridor.   Cardiovascular: Negative for chest pain, palpitations, orthopnea and PND.  Gastrointestinal: Negative for abdominal pain, diarrhea, nausea and vomiting.  Genitourinary: Negative for frequency and urgency.  Musculoskeletal: Positive for joint pain. Negative for back pain.  Neurological: Negative for sensory change, speech change, focal weakness and weakness.  Psychiatric/Behavioral: Negative for depression and hallucinations. The patient is not nervous/anxious.    Tolerating Diet:yes Tolerating PT: snf  DRUG ALLERGIES:  No Known Allergies  VITALS:  Blood pressure (!) 141/77, pulse 93, temperature 97.3 F (36.3 C), temperature source Oral, resp. rate 16, height 5\' 5"  (1.651 m), weight 73.7 kg (162 lb 8 oz), SpO2 99 %.  PHYSICAL EXAMINATION:   Physical Exam  GENERAL:  76 y.o.-year-old patient lying in the bed with no acute distress.  EYES: Pupils equal, round, reactive to light and accommodation. No scleral icterus. Extraocular muscles intact.  HEENT: Head atraumatic, normocephalic. Oropharynx and nasopharynx clear.  NECK:  Supple, no jugular venous distention. No thyroid enlargement, no tenderness.  LUNGS: Normal breath sounds bilaterally, no wheezing, rales, rhonchi. No use of accessory muscles of respiration.  CARDIOVASCULAR: S1, S2 normal. No murmurs, rubs, or gallops.  ABDOMEN: Soft, nontender, nondistended. Bowel sounds present. No organomegaly or mass.  EXTREMITIES: No cyanosis,  clubbing or edema b/l.   Hip surgey drain+ NEUROLOGIC: Cranial nerves II through XII are intact. No focal Motor or sensory deficits b/l.   PSYCHIATRIC:  patient is alert and oriented x 3.  SKIN: No obvious rash, lesion, or ulcer.   LABORATORY PANEL:  CBC  Recent Labs Lab 01/24/17 0406  WBC 7.4  HGB 11.6*  HCT 34.2*  PLT 189    Chemistries   Recent Labs Lab 01/24/17 0406  NA 137  K 3.9  CL 104  CO2 26  GLUCOSE 106*  BUN 14  CREATININE 0.94  CALCIUM 9.3   Cardiac Enzymes No results for input(s): TROPONINI in the last 168 hours. RADIOLOGY:  Dg Hip Port Unilat With Pelvis 1v Right  Result Date: 01/27/2017 CLINICAL DATA:  Status post right hip replacement EXAM: DG HIP (WITH OR WITHOUT PELVIS) 1V PORT RIGHT COMPARISON:  None. FINDINGS: Interval right total hip arthroplasty without failure or complication. No fracture or dislocation. Postsurgical changes in the adjacent soft tissues overlying the right hip. Surgical drain noted over the right greater trochanter. No left hip fracture or dislocation. Mild osteoarthritis of the left hip. IMPRESSION: 1. Interval right total hip arthroplasty without failure or complication. No fracture or dislocation. Electronically Signed   By: Kathreen Devoid   On: 01/27/2017 11:52   Dg Femur, Min 2 Views Right  Result Date: 01/26/2017 CLINICAL DATA:  Thigh pain. EXAM: RIGHT FEMUR 2 VIEWS COMPARISON:  01/23/2017 FINDINGS: There is advanced degenerative changes identified involving the right hip joint. Previous right knee arthroplasty. There is no acute fracture or subluxation identified. IMPRESSION: 1. Advanced right hip osteoarthritis. 2. Previous right knee arthroplasty identified. Electronically Signed   By: Kerby Moors M.D.   On: 01/26/2017 10:08  ASSESSMENT AND PLAN:   Aimee Oliver a 76 y.o. femalewith a known history of Hypertension, hyperlipidemia, chronic arthritis is presenting to the ED with a chief complaint of intractable right hip  pain which is chronically getting worse. Patient actually scheduled to get right hip replacement performed on Friday by Dr. Marry Guan.  #Acute on chronic right hip pain secondary to severe osteoarthritis and right hip joint effusion Pain management as needed Consults orthopedics-Dr. Marry Guan noted. -s/p  right hip arthroplasty POD #0 per dr Marry Guan Resume aspirin in am -PT to be started -d/w dr Milta Deiters and pt is s/p right L 4-5 steroid epidural injection on June 20th  #Right hip joint effusion secondary to severe degenerative arthritis Pain management as needed   #Hyperlipidemia continue statin  #Essential hypertension continue home medication metoprolol  Discussed with patient and husband Case discussed with Care Management/Social Worker. Management plans discussed with the patient, family and they are in agreement.  CODE STATUS: full  DVT Prophylaxis: lovenox  TOTAL TIME TAKING CARE OF THIS PATIENT: 40 minutes.  >50% time spent on counselling and coordination of care  POSSIBLE D/C IN 1-2* DAYS, DEPENDING ON CLINICAL CONDITION.  Note: This dictation was prepared with Dragon dictation along with smaller phrase technology. Any transcriptional errors that result from this process are unintentional.  Maliha Outten M.D on 01/27/2017 at 3:47 PM  Between 7am to 6pm - Pager - [phone]  After 6pm go to [email or web address] - password [email or web address] [facility]  Office  [phone]  CC: Primary care physician; Hortencia Pilar, MD

## 2017-01-27 NOTE — Care Management (Signed)
RNCM consult received. CSW has started authorization for [facility]. WIll follow and assist if patient goes home.

## 2017-01-27 NOTE — Transfer of Care (Signed)
Immediate Anesthesia Transfer of Care Note  Patient: Aimee Oliver  Procedure(s) Performed: Procedure(s): TOTAL HIP ARTHROPLASTY (Right)  Patient Location: PACU  Anesthesia Type:Spinal  Level of Consciousness: awake and sedated  Airway & Oxygen Therapy: Patient Spontanous Breathing and Patient connected to face mask oxygen  Post-op Assessment: Report given to RN and Post -op Vital signs reviewed and stable  Post vital signs: Reviewed and stable  Last Vitals:  Vitals:   01/26/17 2358 01/27/17 0556  BP: (!) 174/79 (!) 135/93  Pulse: 88 88  Resp: 18 18  Temp: 37.1 C 36.6 C    Last Pain:  Vitals:   01/27/17 0556  TempSrc: Oral  PainSc:       Patients Stated Pain Goal: 4 (01/29/75 2831)  Complications: No apparent anesthesia complications

## 2017-01-27 NOTE — Discharge Summary (Signed)
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 Acoustic neuroma ([place])   . Anemia    boarderline anemic  . Arthritis   . Headache    sinus head aches  . Hypercholesterolemia   . Hypertension   . PONV (postoperative nausea and vomiting)    with hysterectomy    HOSPITAL COURSE:  Aimee Oliver a 76 y.o. femalewith a known history of Hypertension, hyperlipidemia, chronic arthritis is presenting to the ED with a chief complaint of intractable right hip pain which is chronically getting worse. Patient actually scheduled to get right hip replacement performed on Friday by Dr. Marry Guan.  #Acute on chronic right hip pain secondary to severe osteoarthritis and right hip joint effusion Pain management as needed Consults orthopedics-Dr. Marry Guan noted. -Dr. Marry Guan for right hip arthroplasty on June 22nd 2018 Resume aspirin prior to d/c -IPT to be started -d/w dr Milta Deiters and pt is s/p right L 4-5 steroid epidural injection on June 20th  #Right hip joint effusion secondary to severe degenerative arthritis Pain management as needed   #Hyperlipidemia continue statin  #Essential hypertension continue home medication metoprolol  Discussed with patient and family. Pt will go to [facility] in 1-2 days This is a PRELIMINARY d/c summary....final d/c summary to follow  CONSULTS OBTAINED:  Treatment Team:  Dereck Leep, MD  DRUG ALLERGIES:  No Known Allergies  DISCHARGE MEDICATIONS:   Current Discharge Medication List    START  taking these medications   Details  celecoxib (CELEBREX) 200 MG capsule Take 1 capsule (200 mg total) by mouth every 12 (twelve) hours. Qty: 30 capsule, Refills: 1    enoxaparin (LOVENOX) 30 MG/0.3ML injection Inject 0.3 mLs (30 mg total) into the skin every 12 (twelve) hours. Qty: 28 Syringe, Refills: 0    ferrous sulfate 325 (65 FE) MG tablet Take 1 tablet (325 mg total) by mouth 2 (two) times daily with a meal. Qty: 30 tablet, Refills: 3    oxyCODONE (OXY IR/ROXICODONE) 5 MG immediate release tablet Take 1-2 tablets (5-10 mg total) by mouth every 4 (four) hours as needed for severe pain. Qty: 30 tablet, Refills: 0      CONTINUE these medications which have NOT CHANGED   Details  acetaminophen (TYLENOL) 650 MG CR tablet Take 1,300 mg by mouth 3 (three) times daily.    Ascorbic Acid (VITAMIN C PO) Take 1 tablet by mouth daily as needed (for immune system booster).    aspirin EC 81 MG tablet Take 81 mg by mouth daily.    Calcium Carbonate-Vit D-Min (CALTRATE 600+D PLUS PO) Take 1 tablet by mouth daily.    Cranberry-Vitamin C-Probiotic (AZO CRANBERRY PO) Take 1 tablet by mouth daily as needed (for UTI prevention).    Lactobacillus (ACIDOPHILUS PO) Take 1 capsule by mouth daily.    metoprolol succinate (TOPROL-XL) 50 MG 24 hr tablet Take 50 mg by mouth at bedtime.    Multiple Vitamin (MULTIVITAMIN WITH MINERALS) TABS tablet Take 1 tablet by mouth daily. One-A-Day Women's 50+  omeprazole (PRILOSEC) 20 MG capsule Take 20 mg by mouth daily before breakfast.    !! OVER THE COUNTER MEDICATION Take 1 tablet by mouth 2 (two) times daily. PHYSIO OMEGA (a blend of DPA, EPA, & DHA FATTY ACIDS)    !! OVER THE COUNTER MEDICATION 1 tablet at bedtime. Applied nutrition Anti-Aging total body daily defense, Co Q10, Vitamin D3, Resveratrol, ginkgo, green tea, lutein and Omega-3 oils    phenylephrine (SUDAFED PE) 10 MG TABS tablet Take 10 mg by mouth every 4 (four) hours as needed.     rosuvastatin (CRESTOR) 40 MG tablet Take 40 mg by mouth at bedtime.     !! - Potential duplicate medications found. Please discuss with provider.      If you experience worsening of your admission symptoms, develop shortness of breath, life threatening emergency, suicidal or homicidal thoughts you must seek medical attention immediately by calling 911 or calling your MD immediately  if symptoms less severe.  You Must read complete instructions/literature along with all the possible adverse reactions/side effects for all the Medicines you take and that have been prescribed to you. Take any new Medicines after you have completely understood and accept all the possible adverse reactions/side effects.   Please note  You were cared for by a hospitalist during your hospital stay. If you have any questions about your discharge medications or the care you received while you were in the hospital after you are discharged, you can call the unit and asked to speak with the hospitalist on call if the hospitalist that took care of you is not available. Once you are discharged, your primary care physician will handle any further medical issues. Please note that NO REFILLS for any discharge medications will be authorized once you are discharged, as it is imperative that you return to your primary care physician (or establish a relationship with a primary care physician if you do not have one) for your aftercare needs so that they can reassess your need for medications and monitor your lab values.   CBC   Recent Labs Lab 01/24/17 0406  WBC 7.4  HGB 11.6*  HCT 34.2*  PLT 189    Chemistries   Recent Labs Lab 01/24/17 0406  NA 137  K 3.9  CL 104  CO2 26  GLUCOSE 106*  BUN 14  CREATININE 0.94  CALCIUM 9.3    Microbiology Results   No results found for this or any previous visit (from the past 240 hour(s)).  RADIOLOGY:  Dg Hip Port Unilat With Pelvis 1v Right  Result Date:  01/27/2017 CLINICAL DATA:  Status post right hip replacement EXAM: DG HIP (WITH OR WITHOUT PELVIS) 1V PORT RIGHT COMPARISON:  None. FINDINGS: Interval right total hip arthroplasty without failure or complication. No fracture or dislocation. Postsurgical changes in the adjacent soft tissues overlying the right hip. Surgical drain noted over the right greater trochanter. No left hip fracture or dislocation. Mild osteoarthritis of the left hip. IMPRESSION: 1. Interval right total hip arthroplasty without failure or complication. No fracture or dislocation. Electronically Signed   By: Kathreen Devoid   On: 01/27/2017 11:52   Dg Femur, Min 2 Views Right  Result Date: 01/26/2017 CLINICAL DATA:  Thigh pain. EXAM: RIGHT FEMUR 2 VIEWS COMPARISON:  01/23/2017 FINDINGS: There is advanced degenerative changes identified involving the right hip joint. Previous right knee arthroplasty. There is no acute fracture or subluxation identified. IMPRESSION: 1. Advanced right hip osteoarthritis. 2. Previous right knee arthroplasty identified. Electronically  Signed   By: Kerby Moors M.D.   On: 01/26/2017 10:08     Management plans discussed with the patient, family and they are in agreement.  CODE STATUS:     Code Status Orders        Start     Ordered   01/23/17 1925  Full code  Continuous     01/23/17 1924    Code Status History    Date Active Date Inactive Code Status Order ID Comments User Context   This patient has a current code status but no historical code status.    Advance Directive Documentation     Most Recent Value  Type of Advance Directive  Healthcare Power of Attorney  Pre-existing out of facility DNR order (yellow form or pink MOST form)  -  "MOST" Form in Place?  -      TOTAL TIME TAKING CARE OF THIS PATIENT: *40 minutes.    Kenitra Leventhal M.D on 01/27/2017 at 3:42 PM  Between 7am to 6pm - Pager - [phone] After 6pm go to [email or web address] - password [email or web address] [facility]  Office  [phone]  CC: Primary care physician; Hortencia Pilar, MD

## 2017-01-27 NOTE — Anesthesia Post-op Follow-up Note (Cosign Needed)
Anesthesia QCDR form completed.        

## 2017-01-27 NOTE — Op Note (Addendum)
OPERATIVE NOTE  DATE OF SURGERY:  01/27/2017  PATIENT NAME:  Aimee Oliver   DOB: 12-20-1940  MRN: [id number]  PRE-OPERATIVE DIAGNOSIS: Degenerative arthrosis of the right hip, primary  POST-OPERATIVE DIAGNOSIS:  Same  PROCEDURE:  Right total hip arthroplasty  SURGEON:  Marciano Sequin. M.D.  ASSISTANT:  Vance Peper, PA (present and scrubbed throughout the case, critical for assistance with exposure, retraction, instrumentation, and closure)  ANESTHESIA: spinal  ESTIMATED BLOOD LOSS: 150 mL  FLUIDS REPLACED: 1100 mL of crystalloid  DRAINS: 2 medium drains to a Hemovac reservoir  IMPLANTS UTILIZED: DePuy 13.5 mm small stature AML femoral stem, 50 mm OD Pinnacle 100 acetabular component, +4 mm neutral Pinnacle Marathon polyethylene insert, and a 32 mm CoCr +1 mm hip ball  INDICATIONS FOR SURGERY: Aimee Oliver is a 76 y.o. year old female with a long history of progressive hip and groin  pain. X-rays demonstrated severe degenerative changes. The patient had not seen any significant improvement despite conservative nonsurgical intervention. After discussion of the risks and benefits of surgical intervention, the patient expressed understanding of the risks benefits and agree with plans for total hip arthroplasty.   The risks, benefits, and alternatives were discussed at length including but not limited to the risks of infection, bleeding, nerve injury, stiffness, blood clots, the need for revision surgery, limb length inequality, dislocation, cardiopulmonary complications, among others, and they were willing to proceed.  PROCEDURE IN DETAIL: The patient was brought into the operating room and, after adequate spinal anesthesia was achieved, the patient was placed in a left lateral decubitus position. Axillary roll was placed and all bony prominences were well-padded. The patient's right hip was cleaned and prepped with alcohol and DuraPrep and draped in the usual sterile fashion. A "timeout"  was performed as per usual protocol. A lateral curvilinear incision was made gently curving towards the posterior superior iliac spine. The IT band was incised in line with the skin incision and the fibers of the gluteus maximus were split in line. The piriformis tendon was identified, skeletonized, and incised at its insertion to the proximal femur and reflected posteriorly. A T type posterior capsulotomy was performed. Prior to dislocation of the femoral head, a threaded Steinmann pin was inserted through a separate stab incision into the pelvis superior to the acetabulum and bent in the form of a stylus so as to assess limb length and hip offset throughout the procedure. The femoral head was then dislocated posteriorly. Inspection of the femoral head demonstrated severe degenerative changes with full-thickness loss of articular cartilage. The femoral neck cut was performed using an oscillating saw. The anterior capsule was elevated off of the femoral neck using a periosteal elevator. Attention was then directed to the acetabulum. The remnant of the labrum was excised using electrocautery. Inspection of the acetabulum also demonstrated significant degenerative changes. The acetabulum was reamed in sequential fashion up to a 49 mm diameter. Good punctate bleeding bone was encountered. A 50 mm Pinnacle 100 acetabular component was positioned and impacted into place. Good scratch fit was appreciated. A neutral polyethylene trial was inserted.  Attention was then directed to the proximal femur. A hole for reaming of the proximal femoral canal was created using a high-speed burr. The femoral canal was reamed in sequential fashion up to a 13 mm diameter. This allowed for approximately 6 cm of scratch fit. Serial broaches were inserted up to a 13.5 mm small stature femoral broach. Calcar region was planed and a trial reduction  was performed using a 32 mm hip ball with a +1 mm neck length. Reasonably good stability was  appreciated but it was decided to trial with a +4 mm polyethylene trial to allow for additional hip offset. Good equalization of limb lengths and hip offset was appreciated and excellent stability was noted both anteriorly and posteriorly. Trial components were removed. The acetabular shell was irrigated with copious amounts of normal saline with antibiotic solution and suctioned dry. A +4 mm Pinnacle Marathon polyethylene insert was positioned and impacted into place. Next, a 13.5 mm small stature AML femoral stem was positioned and impacted into place. Excellent scratch fit was appreciated. A trial reduction was again performed with a 32 mm hip ball with a +1 mm neck length. Again, good equalization of limb lengths was appreciated and excellent stability appreciated both anteriorly and posteriorly. The hip was then dislocated and the trial hip ball was removed. The Morse taper was cleaned and dried. A 32 mm cobalt chrome hip ball with a +1 mm neck length was placed on the trunnion and impacted into place. The hip was then reduced and placed through range of motion. Excellent stability was appreciated both anteriorly and posteriorly.  The wound was irrigated with copious amounts of normal saline with antibiotic solution and suctioned dry. Good hemostasis was appreciated. The posterior capsulotomy was repaired using #5 Ethibond. Piriformis tendon was reapproximated to the undersurface of the gluteus medius tendon using #5 Ethibond. Two medium drains were placed in the wound bed and brought out through separate stab incisions to be attached to a Hemovac reservoir. The IT band was reapproximated using interrupted sutures of #1 Vicryl. Subcutaneous tissue was approximated using first #0 Vicryl followed by #2-0 Vicryl. The skin was closed with skin staples.  The patient tolerated the procedure well and was transported to the recovery room in stable condition.   Marciano Sequin., M.D.

## 2017-01-27 NOTE — Anesthesia Procedure Notes (Signed)
Date/Time: 01/27/2017 7:43 AM Performed by: Nelda Marseille Pre-anesthesia Checklist: Patient identified, Emergency Drugs available, Suction available, Patient being monitored and Timeout performed Oxygen Delivery Method: Simple face mask

## 2017-01-27 NOTE — Anesthesia Preprocedure Evaluation (Addendum)
Anesthesia Evaluation  Patient identified by MRN, date of birth, ID band Patient awake    Reviewed: Allergy & Precautions, NPO status , Patient's Chart, lab work & pertinent test results, reviewed documented beta blocker date and time   History of Anesthesia Complications (+) PONV and history of anesthetic complications  Airway Mallampati: II  TM Distance: >3 FB     Dental  (+) Chipped   Pulmonary           Cardiovascular hypertension, Pt. on home beta blockers and Pt. on medications      Neuro/Psych  Headaches,  Neuromuscular disease    GI/Hepatic   Endo/Other    Renal/GU      Musculoskeletal  (+) Arthritis ,   Abdominal   Peds  Hematology  (+) anemia ,   Anesthesia Other Findings   Reproductive/Obstetrics                             Anesthesia Physical Anesthesia Plan  ASA: III  Anesthesia Plan: Spinal   Post-op Pain Management:    Induction:   PONV Risk Score and Plan:   Airway Management Planned:   Additional Equipment:   Intra-op Plan:   Post-operative Plan:   Informed Consent: I have reviewed the patients History and Physical, chart, labs and discussed the procedure including the risks, benefits and alternatives for the proposed anesthesia with the patient or authorized representative who has indicated his/her understanding and acceptance.     Plan Discussed with: CRNA  Anesthesia Plan Comments:         Anesthesia Quick Evaluation

## 2017-01-28 LAB — CBC
HEMATOCRIT: 29.7 % — AB (ref 35.0–47.0)
HEMOGLOBIN: 10.3 g/dL — AB (ref 12.0–16.0)
MCH: 33.8 pg (ref 26.0–34.0)
MCHC: 34.6 g/dL (ref 32.0–36.0)
MCV: 97.5 fL (ref 80.0–100.0)
Platelets: 274 10*3/uL (ref 150–440)
RBC: 3.05 MIL/uL — ABNORMAL LOW (ref 3.80–5.20)
RDW: 14 % (ref 11.5–14.5)
WBC: 11.5 10*3/uL — AB (ref 3.6–11.0)

## 2017-01-28 LAB — BASIC METABOLIC PANEL
ANION GAP: 9 (ref 5–15)
BUN: 21 mg/dL — ABNORMAL HIGH (ref 6–20)
CHLORIDE: 99 mmol/L — AB (ref 101–111)
CO2: 25 mmol/L (ref 22–32)
Calcium: 8.7 mg/dL — ABNORMAL LOW (ref 8.9–10.3)
Creatinine, Ser: 1.15 mg/dL — ABNORMAL HIGH (ref 0.44–1.00)
GFR calc non Af Amer: 45 mL/min — ABNORMAL LOW (ref >60)
GFR, EST AFRICAN AMERICAN: 52 mL/min — AB (ref >60)
GLUCOSE: 109 mg/dL — AB (ref 65–99)
Potassium: 4.5 mmol/L (ref 3.5–5.1)
Sodium: 133 mmol/L — ABNORMAL LOW (ref 135–145)

## 2017-01-28 MED ORDER — HYDROCODONE-ACETAMINOPHEN 5-325 MG PO TABS
1.0000 | ORAL_TABLET | ORAL | Status: DC | PRN
  Administered 2017-01-28 (×2): 1 via ORAL
  Administered 2017-01-29: 2 via ORAL
  Administered 2017-01-29: 1 via ORAL
  Filled 2017-01-28: qty 1
  Filled 2017-01-28: qty 2
  Filled 2017-01-28: qty 1
  Filled 2017-01-28: qty 2

## 2017-01-28 NOTE — Progress Notes (Signed)
   Subjective: 1 Day Post-Op Procedure(s) (LRB): TOTAL HIP ARTHROPLASTY (Right) Patient reports pain as mild.  Patient states that the pain is much better than it was preoperatively. Resting well. Moving leg freely at will without any discomfort. Complains of no discomfort over the incision site as well. Patient is well, and has had no acute complaints or problems We will start therapy today.  Patient will like to go home following surgery. I did have a discussion with the patient that in order for her to go home she meet certain criterias. She understands this. no nausea and no vomiting Patient denies any chest pains or shortness of breath. Objective: Vital signs in last 24 hours: Temp:  [97.3 F (36.3 C)-98 F (36.7 C)] 98 F (36.7 C) (06/23 0018) Pulse Rate:  [66-93] 79 (06/23 0018) Resp:  [11-24] 18 (06/23 0018) BP: (141-214)/(77-118) 173/77 (06/23 0018) SpO2:  [95 %-100 %] 96 % (06/23 0018) well approximated incision Heels are non tender and elevated off the bed using rolled towels Intake/Output from previous day: 06/22 0701 - 06/23 0700 In: 3631.7 [P.O.:360; I.V.:2771.7; IV Piggyback:500] Out: 2505 [Urine:2200; Drains:155; Blood:150] Intake/Output this shift: No intake/output data recorded.   Recent Labs  01/28/17 0323  HGB 10.3*    Recent Labs  01/28/17 0323  WBC 11.5*  RBC 3.05*  HCT 29.7*  PLT 274    Recent Labs  01/28/17 0323  NA 133*  K 4.5  CL 99*  CO2 25  BUN 21*  CREATININE 1.15*  GLUCOSE 109*  CALCIUM 8.7*   No results for input(s): LABPT, INR in the last 72 hours.  EXAM General - Patient is Alert, Appropriate and Oriented Extremity - Neurologically intact Neurovascular intact Sensation intact distally Intact pulses distally Dorsiflexion/Plantar flexion intact No cellulitis present Compartment soft Dressing - dressing C/D/I Motor Function - intact, moving foot and toes well on exam.    Past Medical History:  Diagnosis Date  .  Acoustic neuroma ([place])   . Anemia    boarderline anemic  . Arthritis   . Headache    sinus head aches  . Hypercholesterolemia   . Hypertension   . PONV (postoperative nausea and vomiting)    with hysterectomy    Assessment/Plan: 1 Day Post-Op Procedure(s) (LRB): TOTAL HIP ARTHROPLASTY (Right) Active Problems:   Acute hip pain, right   S/P total hip arthroplasty  Estimated body mass index is 27.04 kg/m as calculated from the following:   Height as of this encounter: 5\' 5"  (1.651 m).   Weight as of this encounter: 73.7 kg (162 lb 8 oz). Advance diet Up with therapy D/C IV fluids Plan for discharge tomorrow  Labs: Were reviewed and acceptable. DVT Prophylaxis - Lovenox, Foot Pumps and TED hose Weight-Bearing as tolerated to right leg D/C O2 and Pulse OX and try on Room Air Labs tomorrow morning. Begin working on bowel movement.  Aimee Oliver. [place] 01/28/2017, 7:32 AM

## 2017-01-28 NOTE — Evaluation (Signed)
Occupational Therapy Evaluation Patient Details Name: Aimee Oliver MRN: [id number] DOB: 1940-11-01 Today's Date: 01/28/2017    History of Present Illness 76 y.o. female with a known history of Hypertension, hyperlipidemia, chronic arthritis is presenting with a chief complaint of intractable right hip pain which is chronically getting worse. She did have right hip replacement as scheduled AM of 6/22. Possibility of lumbar issues has complicated the situation.     Clinical Impression   Patient is a 76 yo female who underwent right total hip replacement, posterior approach on 01/27/2017.  She lives with her significant other who is able to help with with basic tasks at home.  She was evaluated by OT this date and presents with pain in right hip, muscle weakness, posterior hip precautions, decreased mobility and decreased ability to perform self care tasks.  She would benefit from skilled OT to maximize safety and independence in self care tasks to return home.  She requires min guard for transfers from the chair this date, setup and use of adaptive equipment with cues for lower body dressing skills.  Patient would like to go home.     Follow Up Recommendations  Home health OT    Equipment Recommendations       Recommendations for Other Services       Precautions / Restrictions Precautions Precautions: Fall;Posterior Hip Precaution Booklet Issued: Yes (comment) Restrictions Weight Bearing Restrictions: Yes RLE Weight Bearing: Weight bearing as tolerated      Mobility Bed Mobility               General bed mobility comments: Patient up to the chair this date  Transfers Overall transfer level: Needs assistance Equipment used: Rolling walker (2 wheeled) Transfers: Sit to/from Stand Sit to Stand: Min guard              Balance                                           ADL either performed or assessed with clinical judgement   ADL Overall ADL's :  Needs assistance/impaired Eating/Feeding: Modified independent   Grooming: Modified independent   Upper Body Bathing: Modified independent   Lower Body Bathing: Maximal assistance;Adhering to hip precautions Lower Body Bathing Details (indicate cue type and reason): Patient instructed on long handled bath sponge for lower body bathing Upper Body Dressing : Modified independent   Lower Body Dressing: Set up;With adaptive equipment;Supervision/safety Lower Body Dressing Details (indicate cue type and reason): Instructed on lower body dressing techniques with reacher, sock aid, shoehorn with socks and underwear. Toilet Transfer: Designer, fashion/clothing and Hygiene: Min guard       Functional mobility during ADLs: Min guard       Vision         Perception     Praxis      Pertinent Vitals/Pain Pain Assessment: 0-10 Pain Score: 4  Pain Location: Right groin  Pain Descriptors / Indicators: Operative site guarding Pain Intervention(s): Limited activity within patient's tolerance;Repositioned;Monitored during session     Hand Dominance Right   Extremity/Trunk Assessment Upper Extremity Assessment Upper Extremity Assessment: Overall WFL for tasks assessed   Lower Extremity Assessment Lower Extremity Assessment: Defer to PT evaluation       Communication Communication Communication: No difficulties   Cognition Arousal/Alertness: Awake/alert Behavior During Therapy: WFL for tasks assessed/performed Overall  Cognitive Status: Within Functional Limits for tasks assessed                                 General Comments: Patient reports she would really like to go home and will have some assistance from her significant other.    General Comments       Exercises     Shoulder Instructions      Home Living Family/patient expects to be discharged to:: Private residence Living Arrangements: Spouse/significant other Available Help at  Discharge: Family;Friend(s) Type of Home: Other(Comment) ([place]) Home Access: Stairs to enter Entrance Stairs-Number of Steps: 2   Home Layout: Two level;Able to live on main level with bedroom/bathroom     Bathroom Shower/Tub: Walk-in shower;Door   [employer] Toilet: Handicapped height Bathroom Accessibility: Yes   Home Equipment: Cane - single point          Prior Functioning/Environment Level of Independence: Independent        Comments: Until ~1 week ago pt would have hip issues, but could do basic activities and some prolonged walking, pain has become so severe as to be unable to walk the last 2 days        OT Problem List: Decreased strength;Impaired balance (sitting and/or standing);Decreased knowledge of precautions;Pain;Decreased range of motion;Decreased activity tolerance;Decreased knowledge of use of DME or AE      OT Treatment/Interventions: Self-care/ADL training;DME and/or AE instruction;Therapeutic activities;Balance training;Therapeutic exercise;Patient/family education    OT Goals(Current goals can be found in the care plan section) Acute Rehab OT Goals Patient Stated Goal: To go home and be able to take care of myself OT Goal Formulation: With patient Time For Goal Achievement: 02/03/17 Potential to Achieve Goals: Good  OT Frequency: Min 1X/week   Barriers to D/C:            Co-evaluation              AM-PAC PT "6 Clicks" Daily Activity     Outcome Measure Help from another person eating meals?: None Help from another person taking care of personal grooming?: None Help from another person toileting, which includes using toliet, bedpan, or urinal?: A Little Help from another person bathing (including washing, rinsing, drying)?: A Lot Help from another person to put on and taking off regular upper body clothing?: None Help from another person to put on and taking off regular lower body clothing?: A Lot 6 Click Score: 19   End of Session  Equipment Utilized During Treatment: Gait belt;Rolling walker  Activity Tolerance: Patient tolerated treatment well Patient left: in chair;with call bell/phone within reach;with chair alarm set;with family/visitor present  OT Visit Diagnosis: Muscle weakness (generalized) (M62.81);Unsteadiness on feet (R26.81);Pain Pain - Right/Left: Right Pain - part of body: Hip                Time: 6767-2094 OT Time Calculation (min): 36 min Charges:  OT General Charges $OT Visit: 1 Procedure OT Evaluation $OT Eval Low Complexity: 1 Procedure OT Treatments $Self Care/Home Management : 8-22 mins G-Codes: OT G-codes **NOT FOR INPATIENT CLASS** Functional Limitation: Self care   Sanjana Folz T Chasady [place]well, OTR/L, CLT   Dallyn Bergland 01/28/2017, 12:40 PM

## 2017-01-28 NOTE — Progress Notes (Signed)
Physical Therapy Treatment Patient Details Name: Aimee Oliver MRN: [id number] DOB: [birth year] Today's Date: 01/28/2017    History of Present Illness 76 y.o. female with a known history of Hypertension, hyperlipidemia, chronic arthritis is presenting with a chief complaint of intractable right hip pain which is chronically getting worse. She did have right hip replacement as scheduled AM of 6/22. Possibility of lumbar issues has complicated the situation.      PT Comments    Pt progressing well since last session, improved activity tolerance overall, performing bed exercises with min-mod assist and full available range within precautions, good demonstration of learning safe techniques for transfers, and good tolerance of AMB c RW up to 74ft with mild tachycardia to 120bpm. Pt still needs heavy verbal cues to avoid breaking hip precautions during gait (Internal rotation during turns to the right). Pain well controlled throughout. Pt making progress toward goals overall. Any updated to DC recommendations from PT will be made in this afternoon's note after 2nd BID treatment.    Follow Up Recommendations  SNF     Equipment Recommendations  Rolling walker with 5" wheels    Recommendations for Other Services       Precautions / Restrictions Precautions Precautions: Fall;Posterior Hip Precaution Booklet Issued: Yes (comment) Restrictions Weight Bearing Restrictions: Yes RLE Weight Bearing: Weight bearing as tolerated    Mobility  Bed Mobility Overal bed mobility: Needs Assistance Bed Mobility: Supine to Sit     Supine to sit: Mod assist     General bed mobility comments: Patient up to the chair this date  Transfers Overall transfer level: Needs assistance Equipment used: Rolling walker (2 wheeled) Transfers: Sit to/from Stand Sit to Stand: Min guard         General transfer comment: Practiced multiple times for safety and adherance to posterior hip  precautions.  Ambulation/Gait Ambulation/Gait assistance: Min assist Ambulation Distance (Feet): 90 Feet Assistive device: Rolling walker (2 wheeled)   Gait velocity: 0.32m/s Gait velocity interpretation: <1.8 ft/sec, indicative of risk for recurrent falls General Gait Details: mild dizziness, heavy verbal cues for precautions with turning (practiced 6 times)   Stairs            Wheelchair Mobility    Modified Rankin (Stroke Patients Only)       Balance Overall balance assessment: Needs assistance                                          Cognition Arousal/Alertness: Awake/alert Behavior During Therapy: WFL for tasks assessed/performed Overall Cognitive Status: Within Functional Limits for tasks assessed                                 General Comments: Patient reports she would really like to go home and will have some assistance from her significant other.       Exercises Total Joint Exercises Ankle Circles/Pumps: AROM;20 reps;Supine;Both Quad Sets: 10 reps;AROM;Supine;Right Gluteal Sets: AROM;10 reps;Supine;Both Heel Slides: AAROM;Both;15 reps;Supine Hip ABduction/ADduction: AROM Bridges: Strengthening;Both;15 reps    General Comments        Pertinent Vitals/Pain Pain Assessment: 0-10 Pain Score: 4  Pain Location: Right groin  Pain Descriptors / Indicators: Operative site guarding Pain Intervention(s): Limited activity within patient's tolerance;Repositioned;Monitored during session    Home Living Family/patient expects to be discharged to:: Private residence Living  Arrangements: Spouse/significant other Available Help at Discharge: Family;Friend(s) Type of Home: Other(Comment) ([place]) Home Access: Stairs to enter   Home Layout: Two level;Able to live on main level with bedroom/bathroom Home Equipment: Kasandra Knudsen - single point      Prior Function Level of Independence: Independent      Comments: Until ~1 week ago pt  would have hip issues, but could do basic activities and some prolonged walking, pain has become so severe as to be unable to walk the last 2 days   PT Goals (current goals can now be found in the care plan section) Acute Rehab PT Goals Patient Stated Goal: To go home and be able to take care of myself PT Goal Formulation: With patient Time For Goal Achievement: 02/10/17 Potential to Achieve Goals: Fair Progress towards PT goals: Progressing toward goals    Frequency    BID      PT Plan Other (comment) (Will determine p PM session.)    Co-evaluation              AM-PAC PT "6 Clicks" Daily Activity  Outcome Measure  Difficulty turning over in bed (including adjusting bedclothes, sheets and blankets)?: Total Difficulty moving from lying on back to sitting on the side of the bed? : Total Difficulty sitting down on and standing up from a chair with arms (e.g., wheelchair, bedside commode, etc,.)?: A Lot Help needed moving to and from a bed to chair (including a wheelchair)?: A Lot Help needed walking in hospital room?: A Lot Help needed climbing 3-5 steps with a railing? : Total 6 Click Score: 9    End of Session Equipment Utilized During Treatment: Gait belt Activity Tolerance: Patient tolerated treatment well;Treatment limited secondary to medical complications (Comment) (mild dizziness, tachycardia ) Patient left: with call bell/phone within reach;in chair;with nursing/sitter in room Nurse Communication: Mobility status PT Visit Diagnosis: Muscle weakness (generalized) (M62.81);Difficulty in walking, not elsewhere classified (R26.2) Pain - Right/Left: Right Pain - part of body: Hip     Time: 8022-3361 PT Time Calculation (min) (ACUTE ONLY): 47 min  Charges:  $Gait Training: 8-22 mins $Therapeutic Exercise: 8-22 mins                    G Codes:       12:51 PM, 02-23-2017 Etta Grandchild, PT, DPT Physical Therapist - [place] [phone] (Office)    Buccola,Allan C 02-23-17, 12:48 PM

## 2017-01-28 NOTE — Anesthesia Postprocedure Evaluation (Signed)
Anesthesia Post Note  Patient: GHADEER KASTELIC  Procedure(s) Performed: Procedure(s) (LRB): TOTAL HIP ARTHROPLASTY (Right)  Patient location during evaluation: Nursing Unit Anesthesia Type: Spinal Level of consciousness: oriented and awake and alert Pain management: pain level controlled Vital Signs Assessment: post-procedure vital signs reviewed and stable Respiratory status: spontaneous breathing, respiratory function stable and patient connected to nasal cannula oxygen Cardiovascular status: blood pressure returned to baseline and stable Postop Assessment: no headache, no backache and patient able to bend at knees Anesthetic complications: no     Last Vitals:  Vitals:   01/28/17 0018 01/28/17 0734  BP: (!) 173/77 (!) 183/73  Pulse: 79 81  Resp: 18 19  Temp: 36.7 C 36.6 C    Last Pain:  Vitals:   01/28/17 0800  TempSrc:   PainSc: 5                  Precious Haws Betty Daidone

## 2017-01-28 NOTE — Progress Notes (Signed)
Physical Therapy Treatment Patient Details Name: Aimee Oliver MRN: [id number] DOB: September 27, 1940 Today's Date: 01/28/2017    History of Present Illness 76 y.o. female with a known history of Hypertension, hyperlipidemia, chronic arthritis is presenting with a chief complaint of intractable right hip pain which is chronically getting worse. She did have right hip replacement as scheduled AM of 6/22. Possibility of lumbar issues has complicated the situation.      PT Comments    Pt demonstrating excellent progress this session, able to recite 3 of 3 hip precautions and demonstrate them during mobility, performance of 4 stairs with good safety awareness and minimal effort, progression to a 2-point step-through gait with RW >252ft, and demonstration of HEP performance with modified independence using a gait belt for active assist during heel slides. Pt still requires min-modA for in/out of bed to assist RLE, however her life partner will be available 24/7 to assist with this. Her pain is well managed in session, and no c/o dizziness with AMB as in session earlier today. PT recs updated to HHPT, no DME needs at this time.     Follow Up Recommendations  Home health PT     Equipment Recommendations  None recommended by PT    Recommendations for Other Services       Precautions / Restrictions Precautions Precautions: Fall;Posterior Hip Precaution Booklet Issued: Yes (comment) Restrictions Weight Bearing Restrictions: Yes RLE Weight Bearing: Weight bearing as tolerated    Mobility  Bed Mobility Overal bed mobility: Needs Assistance Bed Mobility: Sit to Supine     Supine to sit: Min assist     General bed mobility comments: 50% physical assistance for RLE and VC for instructions   Transfers Overall transfer level: Needs assistance Equipment used: Rolling walker (2 wheeled) Transfers: Sit to/from Stand Sit to Stand: Supervision         General transfer comment: good demonstration  of strength, control, and safe RW use.   Ambulation/Gait Ambulation/Gait assistance: Min guard Ambulation Distance (Feet): 250 Feet Assistive device: Rolling walker (2 wheeled) Gait Pattern/deviations: Step-through pattern Gait velocity: 0.47m/s  Gait velocity interpretation: <1.8 ft/sec, indicative of risk for recurrent falls General Gait Details: better demonstration of hip precautions during turns this session.    Stairs Stairs: Yes   Stair Management: Two rails;Step to pattern Number of Stairs: 4 General stair comments: ease in effort, no limitations fro pain, minimal VC for up/good/down/bad  Wheelchair Mobility    Modified Rankin (Stroke Patients Only)       Balance Overall balance assessment: Modified Independent                                          Cognition Arousal/Alertness: Awake/alert Behavior During Therapy: WFL for tasks assessed/performed Overall Cognitive Status: Within Functional Limits for tasks assessed                                 General Comments: Patient reports she would really like to go home and will have some assistance from her significant other.       Exercises Total Joint Exercises Ankle Circles/Pumps: 20 reps;Both;Strengthening;Seated Quad Sets: 10 reps;AROM;Supine;Right Gluteal Sets: AROM;10 reps;Supine;Both Short Arc Quad: Strengthening;Supine;15 reps;Right Heel Slides: AAROM;15 reps;Supine;Right (taught self assist with gait belt ) Hip ABduction/ADduction: AROM;Right;15 reps;Supine Long Arc Quad: AROM;Right;Seated;15 reps Bridges: Strengthening;Both;15 reps  General Comments        Pertinent Vitals/Pain Pain Assessment: 0-10 Pain Score: 4  Pain Location: Right groin  Pain Descriptors / Indicators: Operative site guarding Pain Intervention(s): Limited activity within patient's tolerance;Monitored during session;Premedicated before session;Patient requesting pain meds-RN notified     Home Living Family/patient expects to be discharged to:: Private residence Living Arrangements: Spouse/significant other Available Help at Discharge: Family;Friend(s) Type of Home: Other(Comment) ([place]) Home Access: Stairs to enter   [place]: Two level;Able to live on main level with bedroom/bathroom Home Equipment: Kasandra Knudsen - single point      Prior Function Level of Independence: Independent      Comments: Until ~1 week ago pt would have hip issues, but could do basic activities and some prolonged walking, pain has become so severe as to be unable to walk the last 2 days   PT Goals (current goals can now be found in the care plan section) Acute Rehab PT Goals Patient Stated Goal: To go home and be able to take care of myself PT Goal Formulation: With patient Time For Goal Achievement: 02/10/17 Potential to Achieve Goals: Good Progress towards PT goals: Progressing toward goals    Frequency    BID      PT Plan Discharge plan needs to be updated    Co-evaluation              AM-PAC PT "6 Clicks" Daily Activity  Outcome Measure  Difficulty turning over in bed (including adjusting bedclothes, sheets and blankets)?: Total Difficulty moving from lying on back to sitting on the side of the bed? : Total Difficulty sitting down on and standing up from a chair with arms (e.g., wheelchair, bedside commode, etc,.)?: A Lot Help needed moving to and from a bed to chair (including a wheelchair)?: A Lot Help needed walking in hospital room?: A Little Help needed climbing 3-5 steps with a railing? : A Little 6 Click Score: 12    End of Session Equipment Utilized During Treatment: Gait belt Activity Tolerance: Patient tolerated treatment well Patient left: with nursing/sitter in room;in bed;with bed alarm set;with call bell/phone within reach Nurse Communication: Mobility status PT Visit Diagnosis: Muscle weakness (generalized) (M62.81);Difficulty in walking, not  elsewhere classified (R26.2) Pain - Right/Left: Right Pain - part of body: Hip     Time: 1430-1459 PT Time Calculation (min) (ACUTE ONLY): 29 min  Charges:  $Gait Training: 8-22 mins $Therapeutic Exercise: 8-22 mins                    G Codes:       3:14 PM, 2017-02-01 Etta Grandchild, PT, DPT Physical Therapist - [place] [phone] ([place])  [phone] (mobile)    Kanoa Phillippi C Feb 01, 2017, 3:10 PM

## 2017-01-28 NOTE — Clinical Social Work Note (Signed)
CSW updated by PT that patient has been changed from SNF status to HHPT. CSW has not alerted the SNF incase of change on day of discharge. CSW will continue to follow pending such change.  Santiago Bumpers, MSW, Latanya Presser [phone]

## 2017-01-28 NOTE — Progress Notes (Signed)
Patient ID: Aimee Oliver, female   DOB: 17-Apr-1941, 76 y.o.   MRN: [id number]  [facility] PROGRESS NOTE  Aimee Oliver:[id number] DOB: Aug 18, 1940 DOA: 01/23/2017 PCP: Hortencia Pilar, MD  HPI/Subjective: Patient feeling okay. Has not had a bowel movement. Some pain and hip. Did well with physical therapy today.  Objective: Vitals:   01/28/17 0734 01/28/17 1015  BP: (!) 183/73   Pulse: 81 (!) 120  Resp: 19   Temp: 97.9 F (36.6 C)     Filed Weights   01/23/17 1358 01/23/17 1939  Weight: 75.3 kg (166 lb) 73.7 kg (162 lb 8 oz)    ROS: Review of Systems  Constitutional: Negative for chills and fever.  Eyes: Negative for blurred vision.  Respiratory: Negative for cough and shortness of breath.   Cardiovascular: Negative for chest pain.  Gastrointestinal: Positive for constipation. Negative for abdominal pain, diarrhea, nausea and vomiting.  Genitourinary: Negative for dysuria.  Musculoskeletal: Positive for joint pain.  Neurological: Negative for dizziness and headaches.   Exam: Physical Exam  Constitutional: She is oriented to person, place, and time.  HENT:  Nose: No mucosal edema.  Mouth/Throat: No oropharyngeal exudate or posterior oropharyngeal edema.  Eyes: Conjunctivae, EOM and lids are normal. Pupils are equal, round, and reactive to light.  Neck: No JVD present. Carotid bruit is not present. No edema present. No thyroid mass and no thyromegaly present.  Cardiovascular: S1 normal and S2 normal.  Exam reveals no gallop.   No murmur heard. Pulses:      Dorsalis pedis pulses are 2+ on the right side, and 2+ on the left side.  Respiratory: No respiratory distress. She has no wheezes. She has no rhonchi. She has no rales.  GI: Soft. Bowel sounds are normal. There is no tenderness.  Musculoskeletal:       Right ankle: She exhibits swelling.       Left ankle: She exhibits swelling.  Lymphadenopathy:    She has no cervical adenopathy.  Neurological: She is alert  and oriented to person, place, and time. No cranial nerve deficit.  Skin: Skin is warm. No rash noted. Nails show no clubbing.  Psychiatric: She has a normal mood and affect.      Data Reviewed: Basic Metabolic Panel:  Recent Labs Lab 01/23/17 1405 01/24/17 0406 01/28/17 0323  NA 135 137 133*  K 3.5 3.9 4.5  CL 104 104 99*  CO2 23 26 25   GLUCOSE 116* 106* 109*  BUN 16 14 21*  CREATININE 1.02* 0.94 1.15*  CALCIUM 9.6 9.3 8.7*   CBC:  Recent Labs Lab 01/23/17 1405 01/24/17 0406 01/28/17 0323  WBC 10.3 7.4 11.5*  HGB 12.7 11.6* 10.3*  HCT 36.9 34.2* 29.7*  MCV 97.4 98.2 97.5  PLT 223 189 274     Studies: Dg Hip Port Unilat With Pelvis 1v Right  Result Date: 01/27/2017 CLINICAL DATA:  Status post right hip replacement EXAM: DG HIP (WITH OR WITHOUT PELVIS) 1V PORT RIGHT COMPARISON:  None. FINDINGS: Interval right total hip arthroplasty without failure or complication. No fracture or dislocation. Postsurgical changes in the adjacent soft tissues overlying the right hip. Surgical drain noted over the right greater trochanter. No left hip fracture or dislocation. Mild osteoarthritis of the left hip. IMPRESSION: 1. Interval right total hip arthroplasty without failure or complication. No fracture or dislocation. Electronically Signed   By: Kathreen Devoid   On: 01/27/2017 11:52    Scheduled Meds: . acidophilus  1 capsule Oral  Daily  . calcium-vitamin D  1 tablet Oral Daily  . celecoxib  200 mg Oral Q12H  . enoxaparin (LOVENOX) injection  30 mg Subcutaneous Q12H  . ferrous sulfate  325 mg Oral BID WC  . metoCLOPramide  10 mg Oral TID AC & HS  . metoprolol succinate  50 mg Oral QHS  . multivitamin with minerals  1 tablet Oral Daily  . pantoprazole  40 mg Oral BID  . senna-docusate  1 tablet Oral BID  . vitamin C  500 mg Oral Daily   Continuous Infusions: . sodium chloride 100 mL/hr (01/28/17 0618)    Assessment/Plan:  1. Acute on chronic right hip pain secondary to  severe osteoarthritis and right hip joint effusion. Patient is status post right hip arthroplasty. Did pretty well with physical therapy today. Physical therapy will see the patient again tomorrow. Home with home health versus rehabilitation will be decided tomorrow. Patient also had a right lumbar epidural injection on June 20 2. Essential hypertension on metoprolol 3. GERD on Protonix  Code Status:     Code Status Orders        Start     Ordered   01/23/17 1925  Full code  Continuous     01/23/17 1924    Code Status History    Date Active Date Inactive Code Status Order ID Comments User Context   This patient has a current code status but no historical code status.    Advance Directive Documentation     Most Recent Value  Type of Advance Directive  Healthcare Power of Attorney  Pre-existing out of facility DNR order (yellow form or pink MOST form)  -  "MOST" Form in Place?  -     Disposition Plan: Potentially home with home health versus rehabilitation. Tomorrow.  Consultants:  Orthopedic surgery  Time spent: 28 minutes  [place], [place]

## 2017-01-29 ENCOUNTER — Encounter: Payer: Self-pay | Admitting: Orthopedic Surgery

## 2017-01-29 LAB — CBC
HCT: 29.8 % — ABNORMAL LOW (ref 35.0–47.0)
HEMOGLOBIN: 10.2 g/dL — AB (ref 12.0–16.0)
MCH: 33.5 pg (ref 26.0–34.0)
MCHC: 34.2 g/dL (ref 32.0–36.0)
MCV: 98.1 fL (ref 80.0–100.0)
Platelets: 288 10*3/uL (ref 150–440)
RBC: 3.04 MIL/uL — ABNORMAL LOW (ref 3.80–5.20)
RDW: 14.1 % (ref 11.5–14.5)
WBC: 8.3 10*3/uL (ref 3.6–11.0)

## 2017-01-29 LAB — BASIC METABOLIC PANEL
Anion gap: 7 (ref 5–15)
BUN: 23 mg/dL — ABNORMAL HIGH (ref 6–20)
CHLORIDE: 104 mmol/L (ref 101–111)
CO2: 27 mmol/L (ref 22–32)
CREATININE: 1.12 mg/dL — AB (ref 0.44–1.00)
Calcium: 8.9 mg/dL (ref 8.9–10.3)
GFR calc non Af Amer: 46 mL/min — ABNORMAL LOW (ref >60)
GFR, EST AFRICAN AMERICAN: 53 mL/min — AB (ref >60)
Glucose, Bld: 112 mg/dL — ABNORMAL HIGH (ref 65–99)
Potassium: 3.9 mmol/L (ref 3.5–5.1)
SODIUM: 138 mmol/L (ref 135–145)

## 2017-01-29 MED ORDER — LACTULOSE 10 GM/15ML PO SOLN
10.0000 g | Freq: Two times a day (BID) | ORAL | Status: DC | PRN
  Administered 2017-01-29: 10 g via ORAL
  Filled 2017-01-29: qty 30

## 2017-01-29 MED ORDER — HYDROCODONE-ACETAMINOPHEN 5-325 MG PO TABS: 1.0000 | ORAL_TABLET | ORAL | 0 refills | Status: DC | PRN

## 2017-01-29 NOTE — Progress Notes (Signed)
Physical Therapy Treatment Patient Details Name: Aimee Oliver MRN: [id number] DOB: [birth year] Today's Date: 01/29/2017    History of Present Illness 76 y.o. female with a known history of Hypertension, hyperlipidemia, chronic arthritis is presenting with a chief complaint of intractable right hip pain which is chronically getting worse. She did have right hip replacement as scheduled AM of 6/22. Possibility of lumbar issues has complicated the situation.      PT Comments    Transitioning to edge of bed with nursing upon arrival.  Supervision/min guard to ambulate to bathroom and around nursing unit this am.  Verbal review of stairs.  Stated she did them yesterday and felt comfortable. Participated in exercises as described below.  Reviewed hip precautions and answered questions as appropriate.   Follow Up Recommendations  Home health PT     Equipment Recommendations       Recommendations for Other Services       Precautions / Restrictions Precautions Precautions: Fall;Posterior Hip Restrictions Weight Bearing Restrictions: Yes RLE Weight Bearing: Weight bearing as tolerated    Mobility  Bed Mobility Overal bed mobility: Needs Assistance Bed Mobility: Supine to Sit     Supine to sit: Min guard     General bed mobility comments: with rails and HOB raised  Transfers Overall transfer level: Needs assistance Equipment used: Rolling walker (2 wheeled) Transfers: Sit to/from Stand Sit to Stand: Supervision         General transfer comment: good demonstration of strength, control, and safe RW use.   Ambulation/Gait   Ambulation Distance (Feet): 200 Feet Assistive device: Rolling walker (2 wheeled) Gait Pattern/deviations: Step-through pattern   Gait velocity interpretation: <1.8 ft/sec, indicative of risk for recurrent falls     Stairs            Wheelchair Mobility    Modified Rankin (Stroke Patients Only)       Balance Overall balance assessment:  Modified Independent                                          Cognition Arousal/Alertness: Awake/alert Behavior During Therapy: WFL for tasks assessed/performed Overall Cognitive Status: Within Functional Limits for tasks assessed                                        Exercises Other Exercises Other Exercises: standing heel raises, marches, slr and ab/add RLE x 10 Other Exercises: commode with supervision and ind self care    General Comments        Pertinent Vitals/Pain Pain Score: 4  Pain Location: Right groin  Pain Descriptors / Indicators: Operative site guarding Pain Intervention(s): Premedicated before session;Monitored during session;Limited activity within patient's tolerance    Home Living                      Prior Function            PT Goals (current goals can now be found in the care plan section) Progress towards PT goals: Progressing toward goals    Frequency    BID      PT Plan      Co-evaluation              AM-PAC PT "6 Clicks" Daily Activity  Outcome Measure  Difficulty turning over in bed (including adjusting bedclothes, sheets and blankets)?: Total Difficulty moving from lying on back to sitting on the side of the bed? : Total Difficulty sitting down on and standing up from a chair with arms (e.g., wheelchair, bedside commode, etc,.)?: A Little Help needed moving to and from a bed to chair (including a wheelchair)?: A Little Help needed walking in hospital room?: A Little Help needed climbing 3-5 steps with a railing? : A Little 6 Click Score: 14    End of Session Equipment Utilized During Treatment: Gait belt Activity Tolerance: Patient tolerated treatment well     Pain - Right/Left: Right Pain - part of body: Hip     Time: 1829-9371 PT Time Calculation (min) (ACUTE ONLY): 23 min  Charges:  $Gait Training: 8-22 mins $Therapeutic Exercise: 8-22 mins                    G  Codes:       Chesley Noon, PTA 01/29/17, 9:15 AM

## 2017-01-29 NOTE — Clinical Social Work Note (Signed)
CSW saw in chart review that attending MD has confirmed that the patient will discharge home with home health. CSW has updated the SNF of the cancellation of the bed offer. CSW is signing off.  Santiago Bumpers, MSW, Latanya Presser [phone]

## 2017-01-29 NOTE — Care Management Note (Addendum)
Case Management Note  Patient Details  Name: Aimee Oliver MRN: [id number] Date of Birth: 23-Dec-1940  Subjective/Objective:     [name] was unable to accept this referral, no reason given by Ardeen Fillers. Discussed discharge planning with Aimee Oliver. A referral for home health PT and for a RW was called to Melene Muller at [facility].  Aimee Hulick declined an offer of a [place]. Aimee Dise's pharmacy not open yet. Will call later for Lovenox co-pay.              Action/Plan:   Expected Discharge Date:  01/29/17               Expected Discharge Plan:   01/29/17  In-House Referral:     Discharge planning Services     Post Acute Care Choice:   Yes Choice offered to:   Patient  DME Arranged:   RW DME Agency:   [facility]  HH Arranged:   PT HH Agency:   [facility]  Status of Service:   Complete  If discussed at [place] of Stay Meetings, dates discussed:    Additional Comments:  Addelynn Batte A, RN 01/29/2017, 8:07 AM

## 2017-01-29 NOTE — Discharge Instructions (Signed)

## 2017-01-29 NOTE — Progress Notes (Signed)
Patient ID: Aimee Oliver, female   DOB: [birth year], 76 y.o.   MRN: [id number]  [facility] PROGRESS NOTE  JOLYNN BAJOREK ELF:[id number] DOB: Aug 12, 1940 DOA: 01/23/2017 PCP: Hortencia Pilar, MD  HPI/Subjective: Patient feeling okay. Has not had a bowel movement. Some pain and hip. Did well with physical therapy today.  Objective: Vitals:   01/28/17 1450 01/29/17 0004  BP:  (!) 146/71  Pulse: 90 70  Resp:  16  Temp:  98.3 F (36.8 C)    Filed Weights   01/23/17 1358 01/23/17 1939  Weight: 75.3 kg (166 lb) 73.7 kg (162 lb 8 oz)    ROS: Review of Systems  Constitutional: Negative for chills and fever.  Eyes: Negative for blurred vision.  Respiratory: Negative for cough and shortness of breath.   Cardiovascular: Negative for chest pain.  Gastrointestinal: Positive for constipation. Negative for abdominal pain, diarrhea, nausea and vomiting.  Genitourinary: Negative for dysuria.  Musculoskeletal: Positive for joint pain.  Neurological: Negative for dizziness and headaches.   Exam: Physical Exam  Constitutional: She is oriented to person, place, and time.  HENT:  Nose: No mucosal edema.  Mouth/Throat: No oropharyngeal exudate or posterior oropharyngeal edema.  Eyes: Conjunctivae, EOM and lids are normal. Pupils are equal, round, and reactive to light.  Neck: No JVD present. Carotid bruit is not present. No edema present. No thyroid mass and no thyromegaly present.  Cardiovascular: S1 normal and S2 normal.  Exam reveals no gallop.   No murmur heard. Pulses:      Dorsalis pedis pulses are 2+ on the right side, and 2+ on the left side.  Respiratory: No respiratory distress. She has no wheezes. She has no rhonchi. She has no rales.  GI: Soft. Bowel sounds are normal. There is no tenderness.  Musculoskeletal:       Right ankle: She exhibits swelling.       Left ankle: She exhibits swelling.  Lymphadenopathy:    She has no cervical adenopathy.  Neurological: She is alert and  oriented to person, place, and time. No cranial nerve deficit.  Skin: Skin is warm. No rash noted. Nails show no clubbing.  Psychiatric: She has a normal mood and affect.      Data Reviewed: Basic Metabolic Panel:  Recent Labs Lab 01/23/17 1405 01/24/17 0406 01/28/17 0323 01/29/17 0335  NA 135 137 133* 138  K 3.5 3.9 4.5 3.9  CL 104 104 99* 104  CO2 23 26 25 27   GLUCOSE 116* 106* 109* 112*  BUN 16 14 21* 23*  CREATININE 1.02* 0.94 1.15* 1.12*  CALCIUM 9.6 9.3 8.7* 8.9   CBC:  Recent Labs Lab 01/23/17 1405 01/24/17 0406 01/28/17 0323 01/29/17 0335  WBC 10.3 7.4 11.5* 8.3  HGB 12.7 11.6* 10.3* 10.2*  HCT 36.9 34.2* 29.7* 29.8*  MCV 97.4 98.2 97.5 98.1  PLT 223 189 274 288     Studies: Dg Hip Port Unilat With Pelvis 1v Right  Result Date: 01/27/2017 CLINICAL DATA:  Status post right hip replacement EXAM: DG HIP (WITH OR WITHOUT PELVIS) 1V PORT RIGHT COMPARISON:  None. FINDINGS: Interval right total hip arthroplasty without failure or complication. No fracture or dislocation. Postsurgical changes in the adjacent soft tissues overlying the right hip. Surgical drain noted over the right greater trochanter. No left hip fracture or dislocation. Mild osteoarthritis of the left hip. IMPRESSION: 1. Interval right total hip arthroplasty without failure or complication. No fracture or dislocation. Electronically Signed   By: Kathreen Devoid  On: 01/27/2017 11:52    Scheduled Meds: . acidophilus  1 capsule Oral Daily  . calcium-vitamin D  1 tablet Oral Daily  . celecoxib  200 mg Oral Q12H  . enoxaparin (LOVENOX) injection  30 mg Subcutaneous Q12H  . ferrous sulfate  325 mg Oral BID WC  . metoCLOPramide  10 mg Oral TID AC & HS  . metoprolol succinate  50 mg Oral QHS  . multivitamin with minerals  1 tablet Oral Daily  . pantoprazole  40 mg Oral BID  . senna-docusate  1 tablet Oral BID  . vitamin C  500 mg Oral Daily   Continuous Infusions: . sodium chloride 100 mL/hr  (01/28/17 0618)    Assessment/Plan:  1. Acute on chronic right hip pain secondary to severe osteoarthritis and right hip joint effusion. Patient is status post right hip arthroplasty. Did well with PT yesterday. Physical therapy will see the patient again this am prior to discharge. Patient also had a right lumbar epidural injection on June 20. Orthopedic surgery ordered lovenox and pain medications for discharge 2. Essential hypertension on metoprolol 3. GERD on Protonix 4. Constipation- bowel movement needed prior to discharge  Code Status:     Code Status Orders        Start     Ordered   01/23/17 1925  Full code  Continuous     01/23/17 1924    Code Status History    Date Active Date Inactive Code Status Order ID Comments User Context   This patient has a current code status but no historical code status.    Advance Directive Documentation     Most Recent Value  Type of Advance Directive  Healthcare Power of Attorney  Pre-existing out of facility DNR order (yellow form or pink MOST form)  -  "MOST" Form in Place?  -     Disposition Plan:  Home with home health today  Consultants:  Orthopedic surgery  Time spent: 30 minutes  [place], [place]

## 2017-01-29 NOTE — Discharge Summary (Signed)
Physician Discharge Summary  Patient ID: Aimee Oliver MRN: [id number] DOB/AGE: 77-Jun-1942 77 y.o.  Admit date: 01/23/2017 Discharge date: 01/29/2017  Admission Diagnoses:  Right hip pain [M25.551]   Discharge Diagnoses: Patient Active Problem List   Diagnosis Date Noted  . Aimee Oliver 01/27/2017  . Acute hip pain, right 01/23/2017    Past Medical History:  Diagnosis Date  . Acoustic neuroma ([place])   . Anemia    boarderline anemic  . Arthritis   . Headache    sinus head aches  . Hypercholesterolemia   . Hypertension   . PONV (postoperative nausea and vomiting)    with hysterectomy     Transfusion: No transfusions during this admission   Consultants (if any): Hospitalist: Dr. Margaretmary Eddy and Dr. Posey Pronto  Discharged Condition: Improved  Hospital Course: Aimee Oliver is an 76 y.o. female who was admitted 01/23/2017 with a diagnosis of severe right hip and groin pain. Patient was tentatively scheduled to have surgery on 01/27/2017 with due to the severity of increased pain was seen in the emergency room where she was then admitted for pain control. Initially Dr. Marry Guan was consult to for evaluation of the right hip pain. Patient was noted to have severe pain with any movement of the right hip. She would not allow physical therapy to move the hip. Patient had x-rays which did not reveal any acute fractures of the hip as well as femur. She was noted to have slight worsening of her degenerative arthrosis to the hip. There was some concern that this pain may be related to her back subsequently had a epidural steroid injection which gave her some very mild relief. Patient was taken to the operating room on 01/27/2017 and underwent right total hip Oliver. Patient had significant improvement of her pain following the procedure. She has done extremely well. Met all goals go home on day 1.   Surgeries:Procedure(s): TOTAL HIP Oliver on 01/23/2017 - 01/27/2017  PPRE-OPERATIVE  DIAGNOSIS: Degenerative arthrosis of the right hip, primary  POST-OPERATIVE DIAGNOSIS:  Same  PROCEDURE:  Right total hip Oliver  SURGEON:  Marciano Sequin. M.D.  ASSISTANT:  Vance Peper, PA (present and scrubbed throughout the case, critical for assistance with exposure, retraction, instrumentation, and closure)  ANESTHESIA: spinal  ESTIMATED BLOOD LOSS: 150 mL  FLUIDS REPLACED: 1100 mL of crystalloid  DRAINS: 2 medium drains to a Hemovac reservoir  IMPLANTS UTILIZED: DePuy 13.5 mm small stature AML femoral stem, 50 mm OD Pinnacle 100 acetabular component, +4 mm neutral Pinnacle Marathon polyethylene insert, and a 32 mm CoCr +1 mm hip ball  INDICATIONS FOR SURGERY: Aimee Oliver is a 76 y.o. year old female with a long history of progressive hip and groin  pain. X-rays demonstrated severe degenerative changes. The patient had not seen any significant improvement despite conservative nonsurgical intervention. After discussion of the risks and benefits of surgical intervention, the patient expressed understanding of the risks benefits and agree with plans for total hip Oliver.   The risks, benefits, and alternatives were discussed at length including but not limited to the risks of infection, bleeding, nerve injury, stiffness, blood clots, the need for revision surgery, limb length inequality, dislocation, cardiopulmonary complications, among others, and they were willing to proceed.atient tolerated the surgery well. No complications .Patient was taken to PACU where she was stabilized and then transferred to the orthopedic floor.  Patient started on Lovenox 30 mg q 12 hrs. Foot pumps applied bilaterally at 80 mm hgb.  Heels elevated off bed with rolled towels. No evidence of DVT. Calves non tender. Negative Homan. Physical therapy started on day #1 for gait training and transfer with OT starting on  day #1 for ADL and assisted devices. Patient has done well with therapy.  Ambulated greater than 250 feet upon being discharged.  Patient's IV And Foley were discontinued on day #1 with Hemovac being discontinued on day #2. Dressing was changed on day 2 prior to patient being discharged   She was given perioperative antibiotics:  Anti-infectives    Start     Dose/Rate Route Frequency Ordered Stop   01/27/17 1400  ceFAZolin (ANCEF) IVPB 2g/100 mL premix     2 g 200 mL/hr over 30 Minutes Intravenous Every 6 hours 01/27/17 1306 01/28/17 0857   01/26/17 0815  ceFAZolin (ANCEF) IVPB 2g/100 mL premix    Comments:  SEND WITH THE PATIENT TO THE OR. DO NOT ADMINISTER ON THE UNIT!   2 g 200 mL/hr over 30 Minutes Intravenous To Surgery 01/26/17 0814 01/27/17 0820   01/24/17 0600  ceFAZolin (ANCEF) IVPB 2g/100 mL premix  Status:  Discontinued     2 g 200 mL/hr over 30 Minutes Intravenous On call to O.R. 01/23/17 1939 01/23/17 2057    .  She was fitted with AV 1 compression foot pump devices, instructed on heel pumps, early ambulation, and fitted with TED stockings bilaterally for DVT prophylaxis.  She benefited maximally from the hospital stay and there were no complications.    Recent vital signs:  Vitals:   01/28/17 1450 01/29/17 0004  BP:  (!) 146/71  Pulse: 90 70  Resp:  16  Temp:  98.3 F (36.8 C)    Recent laboratory studies:  Lab Results  Component Value Date   HGB 10.2 (L) 01/29/2017   HGB 10.3 (L) 01/28/2017   HGB 11.6 (L) 01/24/2017   Lab Results  Component Value Date   WBC 8.3 01/29/2017   PLT 288 01/29/2017   Lab Results  Component Value Date   INR 0.99 01/13/2017   Lab Results  Component Value Date   NA 138 01/29/2017   K 3.9 01/29/2017   CL 104 01/29/2017   CO2 27 01/29/2017   BUN 23 (H) 01/29/2017   CREATININE 1.12 (H) 01/29/2017   GLUCOSE 112 (H) 01/29/2017    Discharge Medications:   Allergies as of 01/29/2017   No Known Allergies     Medication List    TAKE these medications   acetaminophen 650 MG CR  tablet Commonly known as:  TYLENOL Take 1,300 mg by mouth 3 (three) times daily.   ACIDOPHILUS PO Take 1 capsule by mouth daily.   aspirin EC 81 MG tablet Take 81 mg by mouth daily.   AZO CRANBERRY PO Take 1 tablet by mouth daily as needed (for UTI prevention).   CALTRATE 600+D PLUS PO Take 1 tablet by mouth daily.   celecoxib 200 MG capsule Commonly known as:  CELEBREX Take 1 capsule (200 mg total) by mouth every 12 (twelve) hours.   enoxaparin 30 MG/0.3ML injection Commonly known as:  LOVENOX Inject 0.3 mLs (30 mg total) into the skin every 12 (twelve) hours.   ferrous sulfate 325 (65 FE) MG tablet Take 1 tablet (325 mg total) by mouth 2 (two) times daily with a meal.   HYDROcodone-acetaminophen 5-325 MG tablet Commonly known as:  NORCO/VICODIN Take 1-2 tablets by mouth every 4 (four) hours as needed for moderate pain.   metoprolol succinate  50 MG 24 hr tablet Commonly known as:  TOPROL-XL Take 50 mg by mouth at bedtime.   multivitamin with minerals Tabs tablet Take 1 tablet by mouth daily. One-A-Day Women's 50+   omeprazole 20 MG capsule Commonly known as:  PRILOSEC Take 20 mg by mouth daily before breakfast.   OVER THE COUNTER MEDICATION Take 1 tablet by mouth 2 (two) times daily. PHYSIO OMEGA (a blend of DPA, EPA, & DHA FATTY ACIDS)   OVER THE COUNTER MEDICATION 1 tablet at bedtime. Applied nutrition Anti-Aging total body daily defense, Co Q10, Vitamin D3, Resveratrol, ginkgo, green tea, lutein and Omega-3 oils   oxyCODONE 5 MG immediate release tablet Commonly known as:  Oxy IR/ROXICODONE Take 1-2 tablets (5-10 mg total) by mouth every 4 (four) hours as needed for severe pain.   phenylephrine 10 MG Tabs tablet Commonly known as:  SUDAFED PE Take 10 mg by mouth every 4 (four) hours as needed.   rosuvastatin 40 MG tablet Commonly known as:  CRESTOR Take 40 mg by mouth at bedtime.   VITAMIN C PO Take 1 tablet by mouth daily as needed (for immune system  booster).            Durable Medical Equipment        Start     Ordered   01/27/17 1306  DME Walker rolling  Once    Question:  Patient needs a walker to treat with the following condition  Answer:  Aimee Oliver   01/27/17 1306   01/27/17 1306  DME Bedside commode  Once    Question:  Patient needs a bedside commode to treat with the following condition  Answer:  Aimee Oliver   01/27/17 1306      Diagnostic Studies: Mr Lumbar Spine Wo Contrast  Result Date: 01/24/2017 CLINICAL DATA:  Initial evaluation for intractable right hip pain. EXAM: MRI LUMBAR SPINE WITHOUT CONTRAST TECHNIQUE: Multiplanar, multisequence MR imaging of the lumbar spine was performed. No intravenous contrast was administered. COMPARISON:  None. FINDINGS: Segmentation: Normal segmentation. Lowest well-formed disc labeled the L5-S1 level. Alignment: 5 mm anterolisthesis of L4 on L5. Prominent scoliosis. Mild exaggeration of the normal lumbar lordosis. Vertebrae: Vertebral body heights are maintained. No evidence for acute or chronic fracture. Scattered chronic reactive endplate changes present throughout the lumbar spine extending from T12-L1 through L4-5. Associated chronic degenerative endplate Schmorl's nodes. Benign hemangioma present within the L1 vertebral body. No concerning osseous lesions. No abnormal marrow edema. Signal intensity within the vertebral body bone marrow within normal limits. Conus medullaris: Extends to the L1 level and appears normal. Paraspinal and other soft tissues: Paraspinous soft tissues within normal limits. Asymmetric chronic atrophy noted within the right psoas muscle. Fatty atrophy noted within the lower paraspinous musculature is well. Visualized visceral structures within normal limits. Disc levels: T12-L1: Chronic diffuse degenerative disc bulge with intervertebral disc space narrowing and disc desiccation. Chronic reactive endplate changes, greater on the  left. No significant canal stenosis. Mild left foraminal narrowing. No significant right foraminal encroachment. L1-2: Chronic diffuse degenerative disc bulge with intervertebral disc space narrowing and disc desiccation. Chronic reactive endplate changes. Facet and ligamentum flavum hypertrophy. Resultant mild canal and left lateral recess stenosis. Mild left L1 foraminal narrowing. No significant right foraminal encroachment. L2-3: Chronic diffuse degenerative disc bulge with intervertebral disc space narrowing and reactive endplate changes. Moderate facet and ligamentum flavum hypertrophy. Resultant mild canal with bilateral lateral recess narrowing, slightly greater on the left. No significant foraminal  encroachment bilaterally. L3-4: Chronic diffuse degenerative disc bulge with intervertebral disc space narrowing. Reactive endplate minimal bilateral L3 foraminal narrowing related to degenerative disc bulge, reactive endplate changes, and facet disease. L4-5: Chronic 5 mm anterolisthesis of L4 on L5. Associated broad posterior disc bulge, most prevalent centrally. Moderate to advanced bilateral facet arthrosis. Resultant mild to moderate canal and bilateral subarticular stenosis. Moderate right L4 foraminal narrowing, potentially affecting the exiting right L4 nerve root (series 3, image 9). Mild left foraminal stenosis. L5-S1: Diffuse degenerative disc bulge with disc desiccation. Mild reactive endplate changes. Moderate bilateral facet arthrosis, right worse than left. No significant canal stenosis. Mild bilateral foraminal narrowing, slightly greater on the right. IMPRESSION: 1. Chronic 5 mm anterolisthesis of L4 on L5 with associated disc bulge and advanced facet arthropathy, resulting in mild-to-moderate canal and bilateral subarticular stenosis moderate right L4 foraminal narrowing. 2. Scoliosis with additional moderate multilevel degenerative spondylolysis and facet arthrosis as above. No other  significant canal or foraminal stenosis identified. No other evidence for frank neural impingement. Please see above report for a full description of these findings. Electronically Signed   By: Jeannine Boga M.D.   On: 01/24/2017 01:50   Mr Hip Right Wo Contrast  Result Date: 01/23/2017 CLINICAL DATA:  76 year old with history of arthritis, scheduled for right hip Oliver 01/27/2017. Increasing right hip pain with ambulation over the last day. No acute injury. No leukocytosis or reported fever. EXAM: MR OF THE RIGHT HIP WITHOUT CONTRAST TECHNIQUE: Multiplanar, multisequence MR imaging was performed. No intravenous contrast was administered. COMPARISON:  Radiographs 01/23/2017.  No previous studies available. FINDINGS: Bones: In correlation with today's radiographs, there is asymmetric arthropathy involving the right hip joint. There is axial migration of the right femoral head with subchondral cyst formation and edema in the femoral head, neck and acetabulum. There is mild subchondral cyst formation posteriorly in the left femoral head. No specific evidence of avascular necrosis. The visualized sacroiliac joints and symphysis pubis appear normal. Lower lumbar spondylosis noted. Articular cartilage and labrum Articular cartilage: As above, asymmetric right hip arthropathy with axial migration, diffuse chondral thinning, subchondral cyst formation and edema. Labrum: There is no gross labral tear or paralabral abnormality. Joint or bursal effusion Joint effusion: Moderate size right hip joint effusion appears mildly complex with periarticular soft tissue edema. There is a small amount of hip joint fluid on the left. Bursae: No focal bursal fluid collections. Muscles and tendons Muscles and tendons: There is some T2 hyperintensity within the right hip adductor musculature. The piriformis musculature appears normal. The gluteus, iliopsoas and hamstring tendons appear unremarkable. Other findings  Miscellaneous: Sigmoid colon diverticular changes are noted. Evidence of previous hysterectomy. The visualized internal pelvic contents otherwise appear unremarkable. IMPRESSION: 1. Asymmetric right hip arthropathy with asymmetric complex right hip joint effusion and periarticular edema. 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 2. No evidence of acute fracture, dislocation or avascular necrosis. 3. Mild left hip arthropathy. 4. Lower lumbar spondylosis. Electronically Signed   By: Richardean Sale M.D.   On: 01/23/2017 17:03   Ir Fluoro Guide Ndl Plmt / Bx  Result Date: 01/25/2017 CLINICAL DATA:  Advanced right hip arthritis, preop hip replacement surgery. Right lower extremity radiculopathy. MR demonstrates lumbar scoliosis with multilevel spondylitic changes, most severe L4-5 with 5 mm anterolisthesis, central canal and subarticular stenosis with right foraminal narrowing. EXAM: LUMBAR EPIDURAL INJECTION UNDER FLUOROSCOPY PROCEDURE: The procedure, risks, benefits, and alternatives were explained to the patient. Questions regarding the procedure were encouraged and  answered. The patient understands and consents to the procedure. Operator donned sterile gloves and mask. The overlying skin was cleansed and anesthetized. An interlaminar approach was performed on the right at L4-5. A 20 gauge spinal epidural needle was advanced using loss-of-resistance technique. Injection of Omnipaque 180 shows a good epidural pattern with spread above and below the level of needle placement. No intrathecal or vascular opacification is seen. 155m of Depo-Medrol mixed with 552mlidocaine 1% were instilled. The procedure was well-tolerated, and the patient was discharged thirty minutes following the injection in good condition. FLUOROSCOPY TIME:  0.7 minute, 44 uGym2 DAP COMPLICATIONS: None IMPRESSION: Technically successful epidural injection on the right at L4-5. Electronically Signed   By: D Lucrezia EuropeM.D.   On: 01/25/2017 13:09   Dg Hip Port Unilat With Pelvis 1v Right  Result Date: 01/27/2017 CLINICAL DATA:  Status post right hip replacement EXAM: DG HIP (WITH OR WITHOUT PELVIS) 1V PORT RIGHT COMPARISON:  None. FINDINGS: Interval right total hip Oliver without failure or complication. No fracture or dislocation. Postsurgical changes in the adjacent soft tissues overlying the right hip. Surgical drain noted over the right greater trochanter. No left hip fracture or dislocation. Mild osteoarthritis of the left hip. IMPRESSION: 1. Interval right total hip Oliver without failure or complication. No fracture or dislocation. Electronically Signed   By: HeKathreen Devoid On: 01/27/2017 11:52   Dg Hip Unilat W Or Wo Pelvis 2-3 Views Right  Result Date: 01/23/2017 CLINICAL DATA:  Chronic right hip pain. EXAM: DG HIP (WITH OR WITHOUT PELVIS) 2-3V RIGHT COMPARISON:  No prior. FINDINGS: Degenerative changes in the lumbar spine and both hips. Sclerotic changes both femoral heads. Avascular necrosis cannot be excluded. Pelvic calcifications noted consistent phleboliths. Peripheral vascular calcification. IMPRESSION: 1. Degenerative changes lumbar spine and both hips. 2. Sclerotic changes both femoral heads. Avascular necrosis cannot be excluded. 3. Peripheral vascular disease . Electronically Signed   By: ThMarcello MooresRegister   On: 01/23/2017 15:07   Dg Femur, Min 2 Views Right  Result Date: 01/26/2017 CLINICAL DATA:  Thigh pain. EXAM: RIGHT FEMUR 2 VIEWS COMPARISON:  01/23/2017 FINDINGS: There is advanced degenerative changes identified involving the right hip joint. Previous right knee Oliver. There is no acute fracture or subluxation identified. IMPRESSION: 1. Advanced right hip osteoarthritis. 2. Previous right knee Oliver identified. Electronically Signed   By: TaKerby Moors.D.   On: 01/26/2017 10:08    Disposition:   Discharge Instructions    Diet - low sodium heart healthy     Complete by:  As directed    Increase activity slowly    Complete by:  As directed    Vital signs    Complete by:  As directed    Upon arrival to nursing area and as patient's condition warrants       Contact information for follow-up providers    HoDereck LeepMD On 03/09/2017.   Specialty:  Orthopedic Surgery Why:  at 10:00am Contact information: [address] [facility] [place] [phone]          Contact information for after-discharge care    [facility].   Specialty:  SkFort Bendnformation: [phone]                 Signed: WOWatt Climes/24/2018, 7:43 AM

## 2017-01-29 NOTE — Progress Notes (Signed)
Patient is ready to be discharged to home with [place] instructions given and patient acknowledged understanding. Patient had no additional questions. Honeycomb dressing changed and gave 2 additional HC dressings per Doctors Instructions. IV's removed from both wrists.  Husband will take patient home.

## 2017-01-29 NOTE — Progress Notes (Signed)
   Subjective: 2 Days Post-Op Procedure(s) (LRB): TOTAL HIP ARTHROPLASTY (Right) Patient reports pain as mild.   Patient is well, and has had no acute complaints or problems Patient did extremely well yesterday with physical therapy and met all cold to go home. Plan is to go Home after hospital stay. nausea and no vomiting Patient denies any chest pains or shortness of breath. Objective: Vital signs in last 24 hours: Temp:  [98.2 F (36.8 C)-98.3 F (36.8 C)] 98.3 F (36.8 C) (06/24 0004) Pulse Rate:  [70-120] 70 (06/24 0004) Resp:  [16-18] 16 (06/24 0004) BP: (146-155)/(71-77) 146/71 (06/24 0004) SpO2:  [96 %-99 %] 99 % (06/24 0004) well approximated incision Heels are non tender and elevated off the bed using rolled towels Intake/Output from previous day: 06/23 0701 - 06/24 0700 In: 1960 [P.O.:960; I.V.:1000] Out: -  Intake/Output this shift: No intake/output data recorded.   Recent Labs  01/28/17 0323 01/29/17 0335  HGB 10.3* 10.2*    Recent Labs  01/28/17 0323 01/29/17 0335  WBC 11.5* 8.3  RBC 3.05* 3.04*  HCT 29.7* 29.8*  PLT 274 288    Recent Labs  01/28/17 0323 01/29/17 0335  NA 133* 138  K 4.5 3.9  CL 99* 104  CO2 25 27  BUN 21* 23*  CREATININE 1.15* 1.12*  GLUCOSE 109* 112*  CALCIUM 8.7* 8.9   No results for input(s): LABPT, INR in the last 72 hours.  EXAM General - Patient is Alert, Appropriate and Oriented Extremity - Neurologically intact Neurovascular intact Sensation intact distally Intact pulses distally Dorsiflexion/Plantar flexion intact No cellulitis present Compartment soft Dressing - dressing C/D/I Motor Function - intact, moving foot and toes well on exam.    Past Medical History:  Diagnosis Date  . Acoustic neuroma ([place])   . Anemia    boarderline anemic  . Arthritis   . Headache    sinus head aches  . Hypercholesterolemia   . Hypertension   . PONV (postoperative nausea and vomiting)    with hysterectomy     Assessment/Plan: 2 Days Post-Op Procedure(s) (LRB): TOTAL HIP ARTHROPLASTY (Right) Active Problems:   Acute hip pain, right   S/P total hip arthroplasty  Estimated body mass index is 27.04 kg/m as calculated from the following:   Height as of this encounter: _0  (1.651 m).   Weight as of this encounter: 73.7 kg (162 lb 8 oz). Up with therapy Discharge home with home health  Labs: Were reviewed and acceptable DVT Prophylaxis - Lovenox, Foot Pumps and TED hose Weight-Bearing as tolerated to right leg Patient needs a bowel movement before being discharged. Lactulose was ordered. Please change dressing prior to patient being discharge and give the patient 2 extra honeycomb dressings to take home. TED stockings are to be on both legs.  Jillyn Ledger. [place] 01/29/2017, 7:36 AM

## 2017-01-31 LAB — SURGICAL PATHOLOGY

## 2017-04-26 ENCOUNTER — Other Ambulatory Visit: Payer: Self-pay | Admitting: Family Medicine

## 2017-04-26 DIAGNOSIS — Z780 Asymptomatic menopausal state: Secondary | ICD-10-CM

## 2018-06-19 ENCOUNTER — Other Ambulatory Visit: Payer: Self-pay | Admitting: Family Medicine

## 2018-06-19 DIAGNOSIS — Z780 Asymptomatic menopausal state: Secondary | ICD-10-CM

## 2018-09-27 ENCOUNTER — Encounter: Payer: Self-pay | Admitting: Emergency Medicine

## 2018-09-27 ENCOUNTER — Other Ambulatory Visit: Payer: Self-pay

## 2018-09-27 ENCOUNTER — Ambulatory Visit
Admission: EM | Admit: 2018-09-27 | Discharge: 2018-09-27 | Disposition: A | Discharge status: Home / Self Care | Payer: Medicare HMO | Attending: Emergency Medicine | Admitting: Emergency Medicine

## 2018-09-27 DIAGNOSIS — J101 Influenza due to other identified influenza virus with other respiratory manifestations: Secondary | ICD-10-CM

## 2018-09-27 LAB — RAPID INFLUENZA A&B ANTIGENS (ARMC ONLY)
INFLUENZA A (ARMC): POSITIVE — AB
INFLUENZA B (ARMC): NEGATIVE

## 2018-09-27 MED ORDER — BENZONATATE 100 MG PO CAPS: 100.0000 mg | ORAL_CAPSULE | Freq: Three times a day (TID) | ORAL | 0 refills | Status: DC | PRN

## 2018-09-27 MED ORDER — OSELTAMIVIR PHOSPHATE 30 MG PO CAPS: 30.0000 mg | ORAL_CAPSULE | Freq: Every day | ORAL | 0 refills | Status: AC

## 2018-09-27 NOTE — ED Provider Notes (Signed)
[facility] ____________________________________________  Time seen: Approximately 11:29 AM  I have reviewed the triage vital signs and the nursing notes.   HISTORY  Chief Complaint Cough (APPT)   HPI Aimee Oliver is a 78 y.o. female presenting for evaluation of 2 days of nasal congestion, nasal drainage, dry cough and chills.  Reports she has had low-grade fevers at 100.  Has taken intermittent extra strength Tylenol and Sudafed congestion.  Some decreased appetite, but overall continues to eat and drink well.  No sore throat.  Denies chest pain, shortness of breath, abdominal pain or dysuria.  States her significant other was diagnosed with influenza A this past Sunday, and she has been helping to care for him.  Denies other aggravating alleviating factors.  Did have flu vaccine this year.  Has continued remain active.  Reports otherwise doing well denies other complaints.  No recent sickness.  Patient does have chronic renal insufficiency.  Patient reports was doing well prior to the last 2 days.  Does have some intermittent nasal congestion in which she reports that her and her doctor feel to be allergies.  Hortencia Pilar, MD: PCP   Past Medical History:  Diagnosis Date  . Acoustic neuroma ([place])   . Anemia    boarderline anemic  . Arthritis   . Headache    sinus head aches  . Hypercholesterolemia   . Hypertension   . PONV (postoperative nausea and vomiting)    with hysterectomy    Patient Active Problem List   Diagnosis Date Noted  . 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 Acute hip pain, right 01/23/2017    Past Surgical History:  Procedure Laterality Date  . ABDOMINAL HYSTERECTOMY    . CATARACT EXTRACTION    . FRACTURE SURGERY Left 2015   wrist  . FRACTURE SURGERY Left 1956   ankle  . IR FLUORO GUIDED NEEDLE PLC ASPIRATION/INJECTION LOC  01/25/2017  . PLACEMENT OF BREAST IMPLANTS    . Right total knee arthroplasty  08/13/2010   Dr. Marry Guan  . TOTAL  HIP ARTHROPLASTY Right 01/27/2017   Procedure: TOTAL HIP ARTHROPLASTY;  Surgeon: Dereck Leep, MD;  Location: [facility] ORS;  Service: Orthopedics;  Laterality: Right;     No current facility-administered medications for this encounter.   Current Outpatient Medications:  .  acetaminophen (TYLENOL) 650 MG CR tablet, Take 1,300 mg by mouth 3 (three) times daily., Disp: , Rfl:  .  Calcium Carbonate-Vit D-Min (CALTRATE 600+D PLUS PO), Take 1 tablet by mouth daily., Disp: , Rfl:  .  Cranberry-Vitamin C-Probiotic (AZO CRANBERRY PO), Take 1 tablet by mouth daily as needed (for UTI prevention)., Disp: , Rfl:  .  Lactobacillus (ACIDOPHILUS PO), Take 1 capsule by mouth daily., Disp: , Rfl:  .  metoprolol succinate (TOPROL-XL) 50 MG 24 hr tablet, Take 50 mg by mouth at bedtime., Disp: , Rfl:  .  Multiple Vitamin (MULTIVITAMIN WITH MINERALS) TABS tablet, Take 1 tablet by mouth daily. One-A-Day Women's 50+, Disp: , Rfl:  .  omeprazole (PRILOSEC) 20 MG capsule, Take 20 mg by mouth daily before breakfast., Disp: , Rfl:  .  oxybutynin (DITROPAN) 5 MG tablet, Take by mouth., Disp: , Rfl:  .  phenylephrine (SUDAFED PE) 10 MG TABS tablet, Take 10 mg by mouth every 4 (four) hours as needed., Disp: , Rfl:  .  rosuvastatin (CRESTOR) 40 MG tablet, Take 40 mg by mouth at bedtime., Disp: , Rfl:  .  benzonatate (TESSALON PERLES) 100 MG capsule,  Take 1 capsule (100 mg total) by mouth 3 (three) times daily as needed for cough., Disp: 15 capsule, Rfl: 0 .  oseltamivir (TAMIFLU) 30 MG capsule, Take 1 capsule (30 mg total) by mouth daily for 5 days., Disp: 5 capsule, Rfl: 0 .  OVER THE COUNTER MEDICATION, Take 1 tablet by mouth 2 (two) times daily. PHYSIO OMEGA (a blend of DPA, EPA, & DHA FATTY ACIDS), Disp: , Rfl:  .  OVER THE COUNTER MEDICATION, 1 tablet at bedtime. Applied nutrition Anti-Aging total body daily defense, Co Q10, Vitamin D3, Resveratrol, ginkgo, green tea, lutein and Omega-3 oils, Disp: , Rfl:    Allergies Patient has no known allergies.  Family History  Problem Relation Age of Onset  . Stomach cancer Mother   . Leukemia Father     Social History Social History   Tobacco Use  . Smoking status: Never Smoker  . Smokeless tobacco: Never Used  Substance Use Topics  . Alcohol use: Yes    Comment: 1 cocktail every night  . Drug use: No    Review of Systems Constitutional: As above, positive low-grade fevers. ENT: No sore throat.  Positive nasal congestion Cardiovascular: Denies chest pain. Respiratory: Denies shortness of breath. Gastrointestinal: No abdominal pain.  No nausea, no vomiting.  No diarrhea.   Genitourinary: Negative for dysuria. Musculoskeletal: Negative for back pain. Skin: Negative for rash.  ____________________________________________   PHYSICAL EXAM:  VITAL SIGNS: ED Triage Vitals  Enc Vitals Group     BP 09/27/18 1047 (!) 157/90     Pulse Rate 09/27/18 1047 93     Resp 09/27/18 1047 18     Temp 09/27/18 1047 99.1 F (37.3 C)     Temp Source 09/27/18 1047 Oral     SpO2 09/27/18 1047 99 %     Weight 09/27/18 1047 140 lb (63.5 kg)     Height 09/27/18 1047 5' 3.5" (1.613 m)     Head Circumference --      Peak Flow --      Pain Score 09/27/18 1046 8     Pain Loc --      Pain Edu? --      Excl. in [place]? --    Constitutional: Alert and oriented. Well appearing and in no acute distress. Eyes: Conjunctivae are normal.  Head: Atraumatic. No sinus tenderness to palpation. No swelling. No erythema.  Ears: no erythema, normal TMs bilaterally.   Nose:Nasal congestion   Mouth/Throat: Mucous membranes are moist. No pharyngeal erythema. No tonsillar swelling or exudate.  Neck: No stridor.  No cervical spine tenderness to palpation. Hematological/Lymphatic/Immunilogical: No cervical lymphadenopathy. Cardiovascular: Normal rate, regular rhythm. Grossly normal heart sounds.  Good peripheral circulation. Respiratory: Normal respiratory effort.  No  retractions. No wheezes, rales or rhonchi. Good air movement.  Musculoskeletal: Ambulatory with steady gait. Neurologic:  Normal speech and language. No gait instability. Skin:  Skin appears warm, dry Psychiatric: Mood and affect are normal. Speech and behavior are normal. ___________________________________________   LABS (all labs ordered are listed, but only abnormal results are displayed)  Labs Reviewed  RAPID INFLUENZA A&B ANTIGENS ([facility] ONLY) - Abnormal; Notable for the following components:      Result Value   Influenza A [facility]) POSITIVE (*)    All other components within normal limits    Via care everywhere labs from most recent visit 06/15/2018 creatinine 1.7.  Creatinine clearance was calculated to be 27. ____________________________________________  PROCEDURES Procedures    INITIAL IMPRESSION / ASSESSMENT AND  PLAN / ED COURSE  Pertinent labs & imaging results that were available during my care of the patient were reviewed by me and considered in my medical decision making (see chart for details).  Well-appearing patient.  No acute distress.  Lungs clear throughout.  Patient with influenza-like illness, suspect influenza.  Positive exposure.  Influenza a positive.  Patient does have chronic renal insufficiency with creatinine clearance calculated based on most recent labs to be a 27.  Discussed use of Tamiflu, patient request, renal dose Tamiflu 30 mg daily given.  Parent Best boy as needed.  Continue Tylenol as needed for fever, Motrin as needed for breakthrough fever increase.  Fluids, supportive care.  Encourage primary care follow-up in 3 days for recheck reevaluation.  Discussed sooner return parameters. Discussed indication, risks and benefits of medications with patient.  Discussed follow up and return parameters including no resolution or any worsening concerns. Patient verbalized understanding and agreed to plan.    ____________________________________________   FINAL CLINICAL IMPRESSION(S) / ED DIAGNOSES  Final diagnoses:  Influenza A     ED Discharge Orders         Ordered    oseltamivir (TAMIFLU) 30 MG capsule  Daily     09/27/18 1132    benzonatate (TESSALON PERLES) 100 MG capsule  3 times daily PRN     09/27/18 1132           Note: This dictation was prepared with Dragon dictation along with smaller phrase technology. Any transcriptional errors that result from this process are unintentional.         Marylene Land, NP 09/27/18 1227

## 2018-09-27 NOTE — ED Triage Notes (Signed)
Patient in today c/o cough, headache, sneezing, no appetite and fever (100)  x 2-3 days. Husband tested positive for Influenza A on Sunday (09/23/18)

## 2018-09-27 NOTE — Discharge Instructions (Addendum)
Take medication as prescribed. Rest. Drink plenty of fluids.  Continue over-the-counter Tylenol as needed for chills and fever, as needed ibuprofen.  Follow up with your primary care physician this week for follow up. Return to Urgent care for new or worsening concerns.

## 2019-09-23 ENCOUNTER — Other Ambulatory Visit: Payer: Self-pay | Admitting: Family Medicine

## 2019-09-23 DIAGNOSIS — R7989 Other specified abnormal findings of blood chemistry: Secondary | ICD-10-CM

## 2019-10-09 ENCOUNTER — Ambulatory Visit
Admission: RE | Admit: 2019-10-09 | Discharge: 2019-10-09 | Disposition: A | Discharge status: Home / Self Care | Payer: Medicare HMO | Source: Ambulatory Visit | Attending: Family Medicine | Admitting: Family Medicine

## 2019-10-09 ENCOUNTER — Other Ambulatory Visit: Payer: Self-pay

## 2019-10-09 DIAGNOSIS — R7989 Other specified abnormal findings of blood chemistry: Secondary | ICD-10-CM

## 2020-03-09 DIAGNOSIS — N184 Chronic kidney disease, stage 4 (severe): Secondary | ICD-10-CM | POA: Diagnosis present

## 2020-04-10 ENCOUNTER — Other Ambulatory Visit: Payer: Self-pay | Admitting: Otolaryngology

## 2020-04-10 DIAGNOSIS — D333 Benign neoplasm of cranial nerves: Secondary | ICD-10-CM

## 2020-04-28 ENCOUNTER — Ambulatory Visit
Admission: RE | Admit: 2020-04-28 | Discharge: 2020-04-28 | Disposition: A | Discharge status: Home / Self Care | Payer: Medicare HMO | Source: Ambulatory Visit | Attending: Otolaryngology | Admitting: Otolaryngology

## 2020-04-28 ENCOUNTER — Other Ambulatory Visit: Payer: Self-pay

## 2020-04-28 DIAGNOSIS — D333 Benign neoplasm of cranial nerves: Secondary | ICD-10-CM | POA: Diagnosis not present

## 2020-04-28 MED ORDER — GADOBUTROL 1 MMOL/ML IV SOLN
6.0000 mL | Freq: Once | INTRAVENOUS | Status: AC | PRN
  Administered 2020-04-28: 10:00:00 6 mL via INTRAVENOUS

## 2020-12-29 DIAGNOSIS — K219 Gastro-esophageal reflux disease without esophagitis: Secondary | ICD-10-CM | POA: Diagnosis present

## 2020-12-29 DIAGNOSIS — N3281 Overactive bladder: Secondary | ICD-10-CM | POA: Diagnosis present

## 2021-01-15 DIAGNOSIS — I10 Essential (primary) hypertension: Secondary | ICD-10-CM | POA: Diagnosis present

## 2021-06-01 ENCOUNTER — Other Ambulatory Visit: Payer: Self-pay | Admitting: Otolaryngology

## 2021-06-01 ENCOUNTER — Other Ambulatory Visit (HOSPITAL_COMMUNITY): Payer: Self-pay | Admitting: Otolaryngology

## 2021-06-01 DIAGNOSIS — R42 Dizziness and giddiness: Secondary | ICD-10-CM

## 2021-06-10 ENCOUNTER — Ambulatory Visit
Admission: RE | Admit: 2021-06-10 | Discharge: 2021-06-10 | Disposition: A | Discharge status: Home / Self Care | Payer: Medicare HMO | Source: Ambulatory Visit | Attending: Otolaryngology | Admitting: Otolaryngology

## 2021-06-10 ENCOUNTER — Other Ambulatory Visit: Payer: Self-pay

## 2021-06-10 DIAGNOSIS — R42 Dizziness and giddiness: Secondary | ICD-10-CM | POA: Diagnosis present

## 2021-06-10 MED ORDER — GADOBUTROL 1 MMOL/ML IV SOLN
6.0000 mL | Freq: Once | INTRAVENOUS | Status: AC | PRN
  Administered 2021-06-10: 6 mL via INTRAVENOUS

## 2021-07-08 ENCOUNTER — Other Ambulatory Visit: Payer: Self-pay

## 2021-07-08 ENCOUNTER — Inpatient Hospital Stay
Admission: EM | Admit: 2021-07-08 | Discharge: 2021-07-11 | DRG: 683 | Disposition: A | Discharge status: Home / Self Care | Payer: Medicare HMO | Source: Ambulatory Visit | Attending: Internal Medicine | Admitting: Internal Medicine

## 2021-07-08 ENCOUNTER — Encounter: Payer: Self-pay | Admitting: Emergency Medicine

## 2021-07-08 DIAGNOSIS — Z20822 Contact with and (suspected) exposure to covid-19: Secondary | ICD-10-CM | POA: Diagnosis present

## 2021-07-08 DIAGNOSIS — D333 Benign neoplasm of cranial nerves: Secondary | ICD-10-CM | POA: Diagnosis present

## 2021-07-08 DIAGNOSIS — M1990 Unspecified osteoarthritis, unspecified site: Secondary | ICD-10-CM | POA: Diagnosis present

## 2021-07-08 DIAGNOSIS — E7800 Pure hypercholesterolemia, unspecified: Secondary | ICD-10-CM | POA: Diagnosis present

## 2021-07-08 DIAGNOSIS — I129 Hypertensive chronic kidney disease with stage 1 through stage 4 chronic kidney disease, or unspecified chronic kidney disease: Secondary | ICD-10-CM | POA: Diagnosis present

## 2021-07-08 DIAGNOSIS — N184 Chronic kidney disease, stage 4 (severe): Secondary | ICD-10-CM | POA: Diagnosis present

## 2021-07-08 DIAGNOSIS — D649 Anemia, unspecified: Secondary | ICD-10-CM | POA: Diagnosis present

## 2021-07-08 DIAGNOSIS — Z79899 Other long term (current) drug therapy: Secondary | ICD-10-CM

## 2021-07-08 DIAGNOSIS — Z90710 Acquired absence of both cervix and uterus: Secondary | ICD-10-CM

## 2021-07-08 DIAGNOSIS — R32 Unspecified urinary incontinence: Secondary | ICD-10-CM | POA: Diagnosis present

## 2021-07-08 DIAGNOSIS — I10 Essential (primary) hypertension: Secondary | ICD-10-CM

## 2021-07-08 DIAGNOSIS — E861 Hypovolemia: Secondary | ICD-10-CM | POA: Diagnosis present

## 2021-07-08 DIAGNOSIS — Z9882 Breast implant status: Secondary | ICD-10-CM

## 2021-07-08 DIAGNOSIS — K219 Gastro-esophageal reflux disease without esophagitis: Secondary | ICD-10-CM | POA: Diagnosis present

## 2021-07-08 DIAGNOSIS — N179 Acute kidney failure, unspecified: Secondary | ICD-10-CM | POA: Diagnosis present

## 2021-07-08 DIAGNOSIS — I951 Orthostatic hypotension: Secondary | ICD-10-CM | POA: Diagnosis present

## 2021-07-08 DIAGNOSIS — E785 Hyperlipidemia, unspecified: Secondary | ICD-10-CM | POA: Diagnosis present

## 2021-07-08 DIAGNOSIS — E871 Hypo-osmolality and hyponatremia: Secondary | ICD-10-CM | POA: Diagnosis present

## 2021-07-08 LAB — CBC WITH DIFFERENTIAL/PLATELET
Abs Immature Granulocytes: 0.03 10*3/uL (ref 0.00–0.07)
Basophils Absolute: 0 10*3/uL (ref 0.0–0.1)
Basophils Relative: 0 %
Eosinophils Absolute: 0.1 10*3/uL (ref 0.0–0.5)
Eosinophils Relative: 2 %
HCT: 29.4 % — ABNORMAL LOW (ref 36.0–46.0)
Hemoglobin: 10.1 g/dL — ABNORMAL LOW (ref 12.0–15.0)
Immature Granulocytes: 0 %
Lymphocytes Relative: 11 %
Lymphs Abs: 0.8 10*3/uL (ref 0.7–4.0)
MCH: 33.4 pg (ref 26.0–34.0)
MCHC: 34.4 g/dL (ref 30.0–36.0)
MCV: 97.4 fL (ref 80.0–100.0)
Monocytes Absolute: 0.9 10*3/uL (ref 0.1–1.0)
Monocytes Relative: 13 %
Neutro Abs: 5.1 10*3/uL (ref 1.7–7.7)
Neutrophils Relative %: 74 %
Platelets: 160 10*3/uL (ref 150–400)
RBC: 3.02 MIL/uL — ABNORMAL LOW (ref 3.87–5.11)
RDW: 12.6 % (ref 11.5–15.5)
WBC: 6.9 10*3/uL (ref 4.0–10.5)
nRBC: 0 % (ref 0.0–0.2)

## 2021-07-08 LAB — COMPREHENSIVE METABOLIC PANEL
ALT: 106 U/L — ABNORMAL HIGH (ref 0–44)
AST: 181 U/L — ABNORMAL HIGH (ref 15–41)
Albumin: 3.9 g/dL (ref 3.5–5.0)
Alkaline Phosphatase: 68 U/L (ref 38–126)
Anion gap: 11 (ref 5–15)
BUN: 49 mg/dL — ABNORMAL HIGH (ref 8–23)
CO2: 20 mmol/L — ABNORMAL LOW (ref 22–32)
Calcium: 9.1 mg/dL (ref 8.9–10.3)
Chloride: 93 mmol/L — ABNORMAL LOW (ref 98–111)
Creatinine, Ser: 2.86 mg/dL — ABNORMAL HIGH (ref 0.44–1.00)
GFR, Estimated: 16 mL/min — ABNORMAL LOW (ref >60)
Glucose, Bld: 90 mg/dL (ref 70–99)
Potassium: 3.3 mmol/L — ABNORMAL LOW (ref 3.5–5.1)
Sodium: 124 mmol/L — ABNORMAL LOW (ref 135–145)
Total Bilirubin: 1.1 mg/dL (ref 0.3–1.2)
Total Protein: 7.2 g/dL (ref 6.5–8.1)

## 2021-07-08 MED ORDER — TRAZODONE HCL 50 MG PO TABS: 25.0000 mg | ORAL_TABLET | Freq: Every evening | ORAL | Status: DC | PRN

## 2021-07-08 MED ORDER — ONDANSETRON HCL 4 MG PO TABS: 4.0000 mg | ORAL_TABLET | Freq: Four times a day (QID) | ORAL | Status: DC | PRN

## 2021-07-08 MED ORDER — PANTOPRAZOLE SODIUM 40 MG PO TBEC
40.0000 mg | DELAYED_RELEASE_TABLET | Freq: Every day | ORAL | Status: DC
  Administered 2021-07-09 – 2021-07-11 (×3): 40 mg via ORAL
  Filled 2021-07-08 (×3): qty 1

## 2021-07-08 MED ORDER — SODIUM CHLORIDE 0.9 % IV BOLUS
1000.0000 mL | Freq: Once | INTRAVENOUS | Status: AC
  Administered 2021-07-08: 1000 mL via INTRAVENOUS

## 2021-07-08 MED ORDER — MAGNESIUM HYDROXIDE 400 MG/5ML PO SUSP
30.0000 mL | Freq: Every day | ORAL | Status: DC | PRN
  Filled 2021-07-08: qty 30

## 2021-07-08 MED ORDER — ONDANSETRON HCL 4 MG/2ML IJ SOLN: 4.0000 mg | Freq: Four times a day (QID) | INTRAMUSCULAR | Status: DC | PRN

## 2021-07-08 MED ORDER — SODIUM CHLORIDE 0.9 % IV BOLUS: 1000.0000 mL | Freq: Once | INTRAVENOUS | Status: DC

## 2021-07-08 MED ORDER — OXYBUTYNIN CHLORIDE 5 MG PO TABS
5.0000 mg | ORAL_TABLET | Freq: Three times a day (TID) | ORAL | Status: DC
  Administered 2021-07-09 – 2021-07-11 (×7): 5 mg via ORAL
  Filled 2021-07-08 (×9): qty 1

## 2021-07-08 MED ORDER — SODIUM CHLORIDE 0.9 % IV SOLN: Freq: Once | INTRAVENOUS | Status: AC

## 2021-07-08 MED ORDER — ACETAMINOPHEN 325 MG PO TABS
650.0000 mg | ORAL_TABLET | Freq: Four times a day (QID) | ORAL | Status: DC | PRN
  Administered 2021-07-10: 650 mg via ORAL
  Filled 2021-07-08 (×2): qty 2

## 2021-07-08 MED ORDER — METOPROLOL SUCCINATE ER 25 MG PO TB24
25.0000 mg | ORAL_TABLET | Freq: Every day | ORAL | Status: DC
  Administered 2021-07-09 – 2021-07-10 (×2): 25 mg via ORAL
  Filled 2021-07-08 (×2): qty 1

## 2021-07-08 MED ORDER — AZO CRANBERRY 250-30 MG PO TABS: ORAL_TABLET | Freq: Every day | ORAL | Status: DC | PRN

## 2021-07-08 MED ORDER — ADULT MULTIVITAMIN W/MINERALS CH
1.0000 | ORAL_TABLET | Freq: Every day | ORAL | Status: DC
Start: 2021-07-09 — End: 2021-07-11
  Administered 2021-07-09 – 2021-07-11 (×3): 1 via ORAL
  Filled 2021-07-08 (×3): qty 1

## 2021-07-08 MED ORDER — RISAQUAD PO CAPS
1.0000 | ORAL_CAPSULE | Freq: Every day | ORAL | Status: DC
  Administered 2021-07-09 – 2021-07-11 (×3): 1 via ORAL
  Filled 2021-07-08 (×3): qty 1

## 2021-07-08 MED ORDER — ACETAMINOPHEN 650 MG RE SUPP: 650.0000 mg | Freq: Four times a day (QID) | RECTAL | Status: DC | PRN

## 2021-07-08 MED ORDER — CALCIUM CITRATE-VITAMIN D 500-12.5 MG-MCG PO CHEW
CHEWABLE_TABLET | Freq: Every day | ORAL | Status: DC
  Filled 2021-07-08 (×3): qty 1

## 2021-07-08 MED ORDER — ROSUVASTATIN CALCIUM 20 MG PO TABS
40.0000 mg | ORAL_TABLET | Freq: Every day | ORAL | Status: DC
  Administered 2021-07-09 – 2021-07-10 (×2): 40 mg via ORAL
  Filled 2021-07-08 (×3): qty 2

## 2021-07-08 MED ORDER — ENOXAPARIN SODIUM 30 MG/0.3ML IJ SOSY
30.0000 mg | PREFILLED_SYRINGE | INTRAMUSCULAR | Status: DC
  Administered 2021-07-09 – 2021-07-11 (×3): 30 mg via SUBCUTANEOUS
  Filled 2021-07-08 (×4): qty 0.3

## 2021-07-08 NOTE — ED Triage Notes (Signed)
Pt went to [facility] for her physical on 11/29 and they told her that her Kidney function was high. They redid her labs today and they were worse. Her Na is 126 and Creatinine is also elevated.

## 2021-07-08 NOTE — H&P (Signed)
[place]   PATIENT NAME: Aimee Oliver    MR#:  [id number]  DATE OF BIRTH:  1941/04/05  DATE OF ADMISSION:  07/08/2021  PRIMARY CARE PHYSICIAN: Sallee Lange, NP   Patient is coming from: Home  REQUESTING/REFERRING PHYSICIAN: Nance Pear, MD  CHIEF COMPLAINT:   Chief Complaint  Patient presents with  . Abnormal Lab    HISTORY OF PRESENT ILLNESS:  Aimee Oliver is a 80 y.o. Caucasian female with medical history significant for hypertension, dyslipidemia and osteoarthritis as well as sinus headaches, coming with acute onset of worsening dizziness and hyponatremia.  The patient had a bi-annual exam yesterday by her PCP and was was ordered labs that showed hyponatremia as well as elevated creatinine.  The patient was advised to drink plenty of fluids.  She drank plenty of water.  Her sodium level today was worse and therefore she came to the ER as advised.  She has been worked with ENT for dizziness and vertigo without tinnitus.  She stated that she had a salivary gland infection for which she was treated with antibiotics and prednisone in the recent past.  She stated that my sinuses feel gigantic.  No nausea or vomiting or abdominal pain.  No chest pain or dyspnea or cough.  She denies any diarrhea or melena or bright red bleeding per rectum.  ED Course: Upon presenting to the ER, vital signs were within normal.  Labs revealed sodium level 124 with chloride of 93, potassium 3.3 and CO2 of 20.  BUN was 49 and creatinine 2.86 compared to 23/1.12 in 2018.  AST was elevated at 181 and ALT 106.  CBC showed anemia with hemoglobin of 10.1 and hematocrit 29.4 comparable to 2018.  Imaging: The patient had a brain MRI earlier this month on 11/3 that showed vestibular schwannoma  on the left that is slightly smaller compared to a year ago.  The patient was given 1 L bolus of IV normal saline followed by 100 mL/h. PAST MEDICAL HISTORY:   Past Medical History:  Diagnosis Date  .  Acoustic neuroma ([place])   . Anemia    boarderline anemic  . Arthritis   . Headache    sinus head aches  . Hypercholesterolemia   . Hypertension   . PONV (postoperative nausea and vomiting)    with hysterectomy    PAST SURGICAL HISTORY:   Past Surgical History:  Procedure Laterality Date  . ABDOMINAL HYSTERECTOMY    . CATARACT EXTRACTION    . FRACTURE SURGERY Left 2015   wrist  . FRACTURE SURGERY Left 1956   ankle  . IR FLUORO GUIDED NEEDLE PLC ASPIRATION/INJECTION LOC  01/25/2017  . PLACEMENT OF BREAST IMPLANTS    . Right total knee arthroplasty  08/13/2010   Dr. Marry Guan  . TOTAL HIP ARTHROPLASTY Right 01/27/2017   Procedure: TOTAL HIP ARTHROPLASTY;  Surgeon: Dereck Leep, MD;  Location: [facility] ORS;  Service: Orthopedics;  Laterality: Right;    SOCIAL HISTORY:   Social History   Tobacco Use  . Smoking status: Never  . Smokeless tobacco: Never  Substance Use Topics  . Alcohol use: Yes    Comment: 1 cocktail every night    FAMILY HISTORY:   Family History  Problem Relation Age of Onset  . Stomach cancer Mother   . Leukemia Father     DRUG ALLERGIES:  No Known Allergies  REVIEW OF SYSTEMS:   ROS As per history of present illness. All pertinent systems  were reviewed above. Constitutional, HEENT, cardiovascular, respiratory, GI, GU, musculoskeletal, neuro, psychiatric, endocrine, integumentary and hematologic systems were reviewed and are otherwise negative/unremarkable except for positive findings mentioned above in the HPI.   MEDICATIONS AT HOME:   Prior to Admission medications   Medication Sig Start Date End Date Taking? Authorizing Provider  acetaminophen (TYLENOL) 650 MG CR tablet Take 1,300 mg by mouth 3 (three) times daily.   Yes [provider]  Calcium Carbonate-Vit D-Min (CALTRATE 600+D PLUS PO) Take 1 tablet by mouth daily.   Yes [provider]  Cranberry-Vitamin C-Probiotic (AZO CRANBERRY PO) Take 1 tablet by mouth daily as  needed (for UTI prevention).   Yes [provider]  Lactobacillus (ACIDOPHILUS PO) Take 1 capsule by mouth daily.   Yes [provider]  metoprolol succinate (TOPROL-XL) 50 MG 24 hr tablet Take 25 mg by mouth at bedtime. 12/06/16  Yes [provider]  Multiple Vitamin (MULTIVITAMIN WITH MINERALS) TABS tablet Take 1 tablet by mouth daily. One-A-Day Women's 50+   Yes [provider]  omeprazole (PRILOSEC) 20 MG capsule Take 20 mg by mouth daily before breakfast. 12/06/16  Yes [provider]  [place] Take 1 tablet by mouth 2 (two) times daily. PHYSIO OMEGA (a blend of DPA, EPA, & DHA FATTY ACIDS)   Yes [provider]  OVER THE COUNTER MEDICATION 1 tablet at bedtime. Applied nutrition Anti-Aging total body daily defense, Co Q10, Vitamin D3, Resveratrol, ginkgo, green tea, lutein and Omega-3 oils   Yes [provider]  oxybutynin (DITROPAN) 5 MG tablet Take 5 mg by mouth 3 (three) times daily. 06/12/21  Yes [provider]  rosuvastatin (CRESTOR) 40 MG tablet Take 40 mg by mouth at bedtime.   Yes [provider]      VITAL SIGNS:  Blood pressure 138/64, pulse 75, temperature 98.2 F (36.8 C), temperature source Oral, resp. rate 20, height 5' 3.5" (1.613 m), SpO2 99 %.  PHYSICAL EXAMINATION:  Physical Exam  GENERAL:  80 y.o.-year-old Caucasian female patient lying in the bed with no acute distress.  EYES: Pupils equal, round, reactive to light and accommodation. No scleral icterus. Extraocular muscles intact.  HEENT: Head atraumatic, normocephalic. Oropharynx and nasopharynx clear.  NECK:  Supple, no jugular venous distention. No thyroid enlargement, no tenderness.  LUNGS: Normal breath sounds bilaterally, no wheezing, rales,rhonchi or crepitation. No use of accessory muscles of respiration.  CARDIOVASCULAR: Regular rate and rhythm, S1, S2 normal. No murmurs, rubs, or gallops.  ABDOMEN: Soft,  nondistended, nontender. Bowel sounds present. No organomegaly or mass.  EXTREMITIES: No pedal edema, cyanosis, or clubbing.  NEUROLOGIC: Cranial nerves II through XII are intact. Muscle strength 5/5 in all extremities. Sensation intact. Gait not checked.  PSYCHIATRIC: The patient is alert and oriented x 3.  Normal affect and good eye contact. SKIN: No obvious rash, lesion, or ulcer.   LABORATORY PANEL:   CBC Recent Labs  Lab 07/08/21 1548  WBC 6.9  HGB 10.1*  HCT 29.4*  PLT 160   ------------------------------------------------------------------------------------------------------------------  Chemistries  Recent Labs  Lab 07/08/21 1548  NA 124*  K 3.3*  CL 93*  CO2 20*  GLUCOSE 90  BUN 49*  CREATININE 2.86*  CALCIUM 9.1  AST 181*  ALT 106*  ALKPHOS 68  BILITOT 1.1   ------------------------------------------------------------------------------------------------------------------  Cardiac Enzymes No results for input(s): TROPONINI in the last 168 hours. ------------------------------------------------------------------------------------------------------------------  RADIOLOGY:  No results found.    IMPRESSION AND PLAN:  Principal Problem:  Hyponatremia  1.  Symptomatic hyponatremia. - The patient will be admitted to a medical bed. - We will obtain hyponatremia work-up. - We will hydrate with IV normal saline and follow sodium levels. - We will restrict p.o. fluid intake.  2.  Acute kidney injury, likely prerenal and hypovolemic. - The patient will be hydrated with IV normal saline and will follow BMP. - We will avoid nephrotoxins.  3.  GERD. - We will continue PPI therapy.  4.  Essential hypertension. - We will continue Toprol-XL.  5.  Dyslipidemia. - We will continue statin therapy.  6.  Left vestibular schwannoma. - We will place the patient on as needed Antivert for vertigo.  DVT prophylaxis: Lovenox.  Code Status: full code.  Family  Communication:  The plan of care was discussed in details with the patient (and family). I answered all questions. The patient agreed to proceed with the above mentioned plan. Further management will depend upon hospital course. Disposition Plan: Back to previous home environment Consults called: none.  All the records are reviewed and case discussed with ED provider.  Status is: Inpatient    Remains inpatient appropriate because:Ongoing diagnostic testing needed not appropriate for outpatient work up, Unsafe d/c plan, IV treatments appropriate due to intensity of illness or inability to take PO, and Inpatient level of care appropriate due to severity of illness   Dispo: The patient is from: Home              Anticipated d/c is to: Home              Patient currently is not medically stable to d/c.              Difficult to place patient: No  TOTAL TIME TAKING CARE OF THIS PATIENT: 55 minutes.     Christel Mormon M.D on 07/08/2021 at 10:49 PM  [facility]   From 7 PM-7 AM, contact night-coverage [email or web address]  CC: Primary care physician; Gauger, Victoriano Lain, NP

## 2021-07-08 NOTE — ED Provider Notes (Signed)
  Emergency Medicine Provider Triage Evaluation Note  ARLET MARTER , a 80 y.o.female,  was evaluated in triage.  Pt complains of abnormal lab.  Patient states that [facility] and they told her that her creatinine was high.  They repeated her labs today and the labs had worsened.  Her sodium is 126 and creatinine is 3.0.  She states that she feels fine and is otherwise asymptomatic.   Review of Systems  Positive: None. Negative: Denies fever, chest pain, vomiting  Physical Exam   Vitals:   07/08/21 1532  BP: 138/64  Pulse: 75  Resp: 20  Temp: 98.2 F (36.8 C)  SpO2: 99%   Gen:   Awake, no distress   Resp:  Normal effort  MSK:   Moves extremities without difficulty  Other:    Medical Decision Making  Given the patient's initial medical screening exam, the following diagnostic evaluation has been ordered. The patient will be placed in the appropriate treatment space, once one is available, to complete the evaluation and treatment. I have discussed the plan of care with the patient and I have advised the patient that an ED physician or mid-level practitioner will reevaluate their condition after the test results have been received, as the results may give them additional insight into the type of treatment they may need.    Diagnostics: Labs  Treatments: IV fluids.   Teodoro Spray, [place] 07/08/21 1547    Nance Pear, MD 07/08/21 [phone]

## 2021-07-08 NOTE — Progress Notes (Signed)
PHARMACIST - PHYSICIAN ORDER COMMUNICATION  CONCERNING: P&T Medication Policy on Herbal Medications  DESCRIPTION:  This patient's order for:  AZO Cranberry 250-30 MG TABS  has been noted.  This product(s) is classified as an "herbal" or natural product. Due to a lack of definitive safety studies or FDA approval, nonstandard manufacturing practices, plus the potential risk of unknown drug-drug interactions while on inpatient medications, the Pharmacy and Therapeutics Committee does not permit the use of "herbal" or natural products of this type within [facility].   ACTION TAKEN: The pharmacy department is unable to verify this order at this time.  Please reevaluate patient's clinical condition at discharge and address if the herbal or natural product(s) should be resumed at that time.   Renda Rolls, PharmD, [facility] 07/08/2021 11:28 PM

## 2021-07-08 NOTE — ED Provider Notes (Signed)
[facility] Emergency Department Provider Note  ____________________________________________   I have reviewed the triage vital signs and the nursing notes.   HISTORY  Chief Complaint Abnormal Lab   History limited by: Not Limited   HPI Aimee Oliver is a 80 y.o. female who presents to the emergency department today at the request of primary care because of concern for abnormal blood work performed at physical. The patient states that except for some dizziness recently she has felt in her normal state of health. This was a scheduled biweekly physical. Patient has history of vertigo so did initially think the dizziness was related to that. Blood work showed elevated creatinine and decreased sodium level. Patient did try drinking fluids as instructed however when she had the blood work repeated it showed worsening values.    Records reviewed. Per medical record review patient has a history of HLD, HTN.  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 Marry Guan   TOTAL HIP ARTHROPLASTY Right 01/27/2017   Procedure: TOTAL HIP ARTHROPLASTY;  Surgeon: Dereck Leep, MD;  Location: [facility] ORS;  Service: Orthopedics;  Laterality: Right;    Prior to Admission medications   Medication Sig Start Date End Date  Taking? Authorizing Provider  acetaminophen (TYLENOL) 650 MG CR tablet Take 1,300 mg by mouth 3 (three) times daily.    [provider]  benzonatate (TESSALON PERLES) 100 MG capsule Take 1 capsule (100 mg total) by mouth 3 (three) times daily as needed for cough. 09/27/18   Marylene Land, NP  Calcium Carbonate-Vit D-Min (CALTRATE 600+D PLUS PO) Take 1 tablet by mouth daily.    [provider]  Cranberry-Vitamin C-Probiotic (AZO CRANBERRY PO) Take 1 tablet by mouth daily as needed (for UTI prevention).    [provider]  Lactobacillus (ACIDOPHILUS PO) Take 1 capsule by mouth daily.    [provider]  metoprolol succinate (TOPROL-XL) 50 MG 24 hr tablet Take 50 mg by mouth at bedtime. 12/06/16   [provider]  Multiple Vitamin (MULTIVITAMIN WITH MINERALS) TABS tablet Take 1 tablet by mouth daily. One-A-Day Women's 50+    [provider]  omeprazole (PRILOSEC) 20 MG capsule Take 20 mg by mouth daily before breakfast. 12/06/16   [provider]  OVER THE COUNTER MEDICATION Take 1 tablet by mouth 2 (two) times daily. PHYSIO OMEGA (a blend of DPA, EPA, & DHA FATTY ACIDS)    [provider]  OVER THE COUNTER MEDICATION 1 tablet at bedtime. Applied nutrition Anti-Aging total body daily defense, Co Q10, Vitamin D3, Resveratrol, ginkgo, green tea, lutein and Omega-3 oils    [provider]  phenylephrine (SUDAFED PE) 10 MG  TABS tablet Take 10 mg by mouth every 4 (four) hours as needed.    [provider]  rosuvastatin (CRESTOR) 40 MG tablet Take 40 mg by mouth at bedtime.    [provider]    Allergies Patient has no known allergies.  Family History  Problem Relation Age of Onset   Stomach cancer Mother    Leukemia Father     Social History Social History   Tobacco Use   Smoking status: Never   Smokeless tobacco: Never  Vaping Use   Vaping Use: Never used  Substance Use Topics   Alcohol use: Yes     Comment: 1 cocktail every night   Drug use: No    Review of Systems Constitutional: No fever/chills Eyes: No visual changes. ENT: No sore throat. Cardiovascular: Denies chest pain. Respiratory: Denies shortness of breath. Gastrointestinal: No abdominal pain.  No nausea, no vomiting.  No diarrhea.   Genitourinary: Negative for dysuria. Musculoskeletal: Negative for back pain. Skin: Negative for rash. Neurological: Positive for dizziness.  ____________________________________________   PHYSICAL EXAM:  VITAL SIGNS: ED Triage Vitals  Enc Vitals Group     BP 07/08/21 1532 138/64     Pulse Rate 07/08/21 1532 75     Resp 07/08/21 1532 20     Temp 07/08/21 1532 98.2 F (36.8 C)     Temp Source 07/08/21 1532 Oral     SpO2 07/08/21 1532 99 %     Weight --      Height 07/08/21 1533 5' 3.5" (1.613 m)     Head Circumference --      Peak Flow --      Pain Score 07/08/21 1533 0    Constitutional: Alert and oriented.  Eyes: Conjunctivae are normal.  ENT      Head: Normocephalic and atraumatic.      Nose: No congestion/rhinnorhea.      Mouth/Throat: Mucous membranes are moist.      Neck: No stridor. Hematological/Lymphatic/Immunilogical: No cervical lymphadenopathy. Cardiovascular: Normal rate, regular rhythm.  No murmurs, rubs, or gallops.  Respiratory: Normal respiratory effort without tachypnea nor retractions. Breath sounds are clear and equal bilaterally. No wheezes/rales/rhonchi. Gastrointestinal: Soft and non tender. No rebound. No guarding.  Genitourinary: Deferred Musculoskeletal: Normal range of motion in all extremities. No lower extremity edema. Neurologic:  Normal speech and language. No gross focal neurologic deficits are appreciated.  Skin:  Skin is warm, dry and intact. No rash noted. Psychiatric: Mood and affect are normal. Speech and behavior are normal. Patient exhibits appropriate insight and judgment.  ____________________________________________     LABS (pertinent positives/negatives)  CBC wbc 6.9, hgb 10.1, plt 160 CMP na 124, k 3.3, glu 90, cr 2.86  ____________________________________________   EKG  None  ____________________________________________    RADIOLOGY  None  ____________________________________________   PROCEDURES  Procedures  ____________________________________________   INITIAL IMPRESSION / ASSESSMENT AND PLAN / ED COURSE  Pertinent labs & imaging results that were available during my care of the patient were reviewed by me and considered in my medical decision making (see chart for details).   Patient presents to the emergency department because of concern for abnormal blood work. Blood work here shows elevated creatinine and decreased sodium level. Do think this could explain some of the patient's dizziness. Will plan on starting IV fluids and admission to the hospital service.  ____________________________________________   FINAL CLINICAL IMPRESSION(S) / ED DIAGNOSES  Final diagnoses:  Hyponatremia  AKI (acute kidney injury) ([place])  Note: This dictation was prepared with Dragon dictation. Any transcriptional errors that result from this process are unintentional     Nance Pear, MD 07/08/21 2238

## 2021-07-09 LAB — CBC
HCT: 26 % — ABNORMAL LOW (ref 36.0–46.0)
Hemoglobin: 8.7 g/dL — ABNORMAL LOW (ref 12.0–15.0)
MCH: 32.5 pg (ref 26.0–34.0)
MCHC: 33.5 g/dL (ref 30.0–36.0)
MCV: 97 fL (ref 80.0–100.0)
Platelets: 120 10*3/uL — ABNORMAL LOW (ref 150–400)
RBC: 2.68 MIL/uL — ABNORMAL LOW (ref 3.87–5.11)
RDW: 12.7 % (ref 11.5–15.5)
WBC: 4.6 10*3/uL (ref 4.0–10.5)
nRBC: 0 % (ref 0.0–0.2)

## 2021-07-09 LAB — BASIC METABOLIC PANEL
Anion gap: 10 (ref 5–15)
BUN: 44 mg/dL — ABNORMAL HIGH (ref 8–23)
CO2: 18 mmol/L — ABNORMAL LOW (ref 22–32)
Calcium: 8.6 mg/dL — ABNORMAL LOW (ref 8.9–10.3)
Chloride: 107 mmol/L (ref 98–111)
Creatinine, Ser: 2.6 mg/dL — ABNORMAL HIGH (ref 0.44–1.00)
GFR, Estimated: 18 mL/min — ABNORMAL LOW (ref >60)
Glucose, Bld: 77 mg/dL (ref 70–99)
Potassium: 3.3 mmol/L — ABNORMAL LOW (ref 3.5–5.1)
Sodium: 135 mmol/L (ref 135–145)

## 2021-07-09 LAB — RESP PANEL BY RT-PCR (FLU A&B, COVID) ARPGX2
Influenza A by PCR: NEGATIVE
Influenza B by PCR: NEGATIVE
SARS Coronavirus 2 by RT PCR: NEGATIVE

## 2021-07-09 LAB — MAGNESIUM: Magnesium: 2.1 mg/dL (ref 1.7–2.4)

## 2021-07-09 MED ORDER — MECLIZINE HCL 25 MG PO TABS
12.5000 mg | ORAL_TABLET | Freq: Three times a day (TID) | ORAL | Status: DC | PRN
  Filled 2021-07-09: qty 0.5

## 2021-07-09 MED ORDER — POTASSIUM CHLORIDE 20 MEQ PO PACK
40.0000 meq | PACK | Freq: Once | ORAL | Status: AC
  Administered 2021-07-09: 40 meq via ORAL
  Filled 2021-07-09: qty 2

## 2021-07-09 MED ORDER — SODIUM CHLORIDE 0.9 % IV SOLN: Freq: Once | INTRAVENOUS | Status: AC

## 2021-07-09 MED ORDER — SODIUM CHLORIDE 0.9 % IV SOLN: INTRAVENOUS | Status: DC

## 2021-07-09 NOTE — Evaluation (Signed)
Physical Therapy Evaluation Patient Details Name: Aimee Oliver MRN: [id number] DOB: 05/15/1941 Today's Date: 07/09/2021  History of Present Illness  Pt is an 80 y/o F admitted on 07/08/21 with c/c of acute onset of worsening dizziness & hyponatremia. 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 PMH: HTN, dyslipidemia, OA, sinus HA, acoustic neuroma  Clinical Impression  Pt seen for PT evaluation with pt reporting she was independent without AD prior to admission. Pt has been seeing ENT for vestibular issues but they recently "fixed the crystals". Pt does report worsening dizziness over the past couple days that is comparable to when her vertigo issues started but then pt also describes the sensation as "swimmy headed" vs dizzy but does report it occurs with positional changes & lasts longer than 1 minute. No nystagmus observed during session and unable to replicate symptoms with dix hallpike maneuver with head rotated to both L & R & bed in trendelenburg. Pt with fluctuating BP, (+) fort orthostatic from sit>stand (see below). Pt is able to ambulate in room without AD & CGA with guarded gait, educated pt to use RW at home. Pt also completes stand pivot bed<>BSC without AD & supervision. Recommend pt d/c home with assistance & resume OPPT she planned to initiate prior to admission.  BP checked in LUE: Supine: 129/79 mmHg (MAP 94) Sitting: 131/117 mmHg (MAP 126) Standing at 0: 106/71 mmHg (MAP 82) Standing at 1: 117/74 mmHg (MAP 88)      Recommendations for follow up therapy are one component of a multi-disciplinary discharge planning process, led by the attending physician.  Recommendations may be updated based on patient status, additional functional criteria and insurance authorization.  Follow Up Recommendations Outpatient PT (pt planning to initiate OPPT prior to hospital admission, recommend  pt continue with this plan)    Assistance Recommended at Discharge Intermittent Supervision/Assistance  Functional Status Assessment Patient has not had a recent decline in their functional status  Equipment Recommendations  Rolling walker (2 wheels)    Recommendations for Other Services       Precautions / Restrictions Precautions Precautions: Fall Restrictions Weight Bearing Restrictions: No      Mobility  Bed Mobility Overal bed mobility: Modified Independent             General bed mobility comments: supine<>sit with HOB elevated    Transfers Overall transfer level: Needs assistance   Transfers: Sit to/from Stand;Bed to chair/wheelchair/BSC Sit to Stand: Min guard;Supervision Stand pivot transfers: Supervision (bed<>BSC)              Ambulation/Gait Ambulation/Gait assistance: Min guard Gait Distance (Feet): 15 Feet (pt declined ambulating into hallway) Assistive device: None Gait Pattern/deviations: Decreased step length - left;Decreased step length - right;Decreased stride length;Decreased dorsiflexion - right;Decreased dorsiflexion - left Gait velocity: decreased     General Gait Details: Guarded gait, decreased step length, BUE up by sides somewhat  Stairs            Wheelchair Mobility    Modified Rankin (Stroke Patients Only)       Balance Overall balance assessment: Needs assistance Sitting-balance support: Feet supported;No upper extremity supported Sitting balance-Leahy Scale: Good     Standing balance support: No upper extremity supported;During functional activity Standing balance-Leahy Scale: Fair  Pertinent Vitals/Pain Pain Assessment: No/denies pain    Home Living Family/patient expects to be discharged to:: Private residence Living Arrangements: Spouse/significant other Available Help at Discharge: Family;Available 24 hours/day Type of Home: House Home Access: Stairs to  enter Entrance Stairs-Rails:  (can hold to the post/doorframe) Entrance Stairs-Number of Steps: 1   Home Layout: Able to live on main level with bedroom/bathroom;Two level Home Equipment: Cane - single point      Prior Function               Mobility Comments: Pt reports she ambulates without AD, no falls in the past 6 months, still driving. Pt does report she mobilizes in a very "guarded" way       Hand Dominance        Extremity/Trunk Assessment   Upper Extremity Assessment Upper Extremity Assessment: Overall WFL for tasks assessed    Lower Extremity Assessment Lower Extremity Assessment: Generalized weakness    Cervical / Trunk Assessment Cervical / Trunk Assessment: Normal  Communication   Communication: No difficulties  Cognition Arousal/Alertness: Awake/alert Behavior During Therapy: WFL for tasks assessed/performed Overall Cognitive Status: Within Functional Limits for tasks assessed                                          General Comments General comments (skin integrity, edema, etc.): Pt with continent void & BM during session, performs peri hygiene without assistance    Exercises     Assessment/Plan    PT Assessment Patient needs continued PT services  PT Problem List Decreased strength;Decreased activity tolerance;Decreased balance;Decreased mobility;Decreased knowledge of use of DME       PT Treatment Interventions DME instruction;Therapeutic exercise;Gait training;Balance training;Stair training;Neuromuscular re-education;Functional mobility training;Therapeutic activities;Patient/family education    PT Goals (Current goals can be found in the Care Plan section)  Acute Rehab PT Goals Patient Stated Goal: go home PT Goal Formulation: With patient Time For Goal Achievement: 07/23/21 Potential to Achieve Goals: Good    Frequency Min 2X/week   Barriers to discharge        Co-evaluation               AM-PAC PT  "6 Clicks" Mobility  Outcome Measure Help needed turning from your back to your side while in a flat bed without using bedrails?: None Help needed moving from lying on your back to sitting on the side of a flat bed without using bedrails?: None Help needed moving to and from a bed to a chair (including a wheelchair)?: A Little Help needed standing up from a chair using your arms (e.g., wheelchair or bedside chair)?: A Little Help needed to walk in hospital room?: A Little Help needed climbing 3-5 steps with a railing? : A Little 6 Click Score: 20    End of Session   Activity Tolerance: Patient tolerated treatment well Patient left: in bed;with call bell/phone within reach   PT Visit Diagnosis: Unsteadiness on feet (R26.81);Muscle weakness (generalized) (M62.81)    Time: 1884-1660 PT Time Calculation (min) (ACUTE ONLY): 25 min   Charges:   PT Evaluation $PT Eval Moderate Complexity: 1 Mod PT Treatments $Therapeutic Activity: 8-22 mins        Lavone Nian, PT, DPT 07/09/21, 1:38 PM   Waunita Schooner 07/09/2021, 1:29 PM

## 2021-07-09 NOTE — ED Notes (Signed)
This tech assisted pt to up to the bsc. Pt is now currently eating breakfast.

## 2021-07-09 NOTE — Progress Notes (Signed)
PROGRESS NOTE  Aimee Oliver  DOB: 1941-02-19  PCP: Sallee Lange, NP [id number]  DOA: 07/08/2021  LOS: 1 day  Hospital Day: 2  Chief Complaint  Patient presents with   Abnormal Lab    Brief narrative: Aimee Oliver is a 80 y.o. female with PMH significant for HTN, HLD, vestibular schwannoma. Patient presented to the ED on 12/21 with complaint acute onset of worsening dizziness and hyponatremia. She was seen by her PCP on 11/30 and was noted to have elevated creatinine, low sodium level and hence referred to the ED. Patient reports that recently she had a salivary gland infection and was treated with antibiotics and prednisone which she completed the course.  However she has not felt completely better.  She admits that she has not been eating adequately and probably not drinking of water either.  Lately she has been getting more dizzy on standing up.  In the ED, patient was hemodynamically stable. Labs with sodium low at 124, potassium low at 3.3, creatinine elevated to 2.86, AST/ALT elevated 181/106. 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  Patient was started on IV normal saline and admitted to hospitalist service  Subjective: Patient was seen and examined this morning.  Pleasant elderly Caucasian female.  Propped up in bed.  Not in distress.  No new symptoms. Chart reviewed Repeat sodium level this morning is up at 135, potassium low at 3.3, creatinine slightly better at 2.6, hemoglobin low at 8.7  Assessment/Plan: Acute worsening of dizziness Known history of left vestibular schwannoma  -Patient has chronic intermittent dizziness related to vestibular schwannoma.  She recently completed a course of antibiotics and prednisone for salivary gland infection but did not feel completely recovered.  She states she has not been eating enough or drinking enough fluid.  She has  also been feeling more dizzy on standing up.  She probably has orthostatic hypotension respirating more dizziness than before.   -Orthostatic vital signs checked today with drop in blood pressure: Sitting up 106/71, standing up 129/79.   -Currently on IV fluid.  Symptom management with Antivert.    Acute hyponatremia -Sodium level was low at 124 at presentation, sedated with elevated creatinine and orthostatic hypotension.  Hyponatremia is likely due to poor solute intake. -It improved significantly after normal in a span of 12 hours.   -Continue to monitor sodium level. Recent Labs  Lab 07/08/21 1548 07/09/21 0534  NA 124* 135   AKI on CKD 4 -Present with a creatinine elevated to 2.86.   -On Care Everywhere, I noted that her creatinine was elevated 1.9 and GFR low at 25 in May 2022.   -Slightly improving creatinine on IV fluid.  Continue IV hydration.  Encourage oral intake. Recent Labs    07/08/21 1548 07/09/21 0534  BUN 49* 44*  CREATININE 2.86* 2.60*   GERD. -Continue PPI therapy.  Essential hypertension. -Continue Toprol-XL.  Dyslipidemia. -Continue statin .  Urinary incontinence -Oxybutynin 3 times daily   Mobility: Encourage ambulation Living condition: Lives at home with significant other Goals of care:   Code Status: Full Code  Nutritional status: Body mass index is 24.95 kg/m.     Diet:  Diet Order             Diet Heart Room service appropriate? Yes; Fluid consistency: Thin  Diet effective now  DVT prophylaxis:  enoxaparin (LOVENOX) injection 30 mg Start: 07/09/21 0800   Antimicrobials: None Fluid: Normal saline at 75 mill per hour Consultants: None Family Communication: None at bedside  Status is: Inpatient  Remains inpatient appropriate because: Needs continued IV hydration.  Dispo: The patient is from: Home              Anticipated d/c is to: Home in 1 to 2 days              Patient currently is not medically stable  to d/c.   Difficult to place patient No     Infusions:     Scheduled Meds:  acidophilus  1 capsule Oral Daily   Calcium Citrate-Vitamin D   Oral Daily   enoxaparin (LOVENOX) injection  30 mg Subcutaneous Q24H   metoprolol succinate  25 mg Oral QHS   multivitamin with minerals  1 tablet Oral Daily   oxybutynin  5 mg Oral TID   pantoprazole  40 mg Oral Daily   rosuvastatin  40 mg Oral QHS    PRN meds: acetaminophen **OR** acetaminophen, magnesium hydroxide, meclizine, ondansetron **OR** ondansetron (ZOFRAN) IV, traZODone   Antimicrobials: Anti-infectives (From admission, onward)    None       Objective: Vitals:   07/09/21 1300 07/09/21 1330  BP: 105/63 125/67  Pulse: 76 69  Resp:  18  Temp:    SpO2: 99% 99%    Intake/Output Summary (Last 24 hours) at 07/09/2021 1410 Last data filed at 07/09/2021 1031 Gross per 24 hour  Intake 1000 ml  Output 350 ml  Net 650 ml   Filed Weights   07/09/21 0300  Weight: 64.9 kg   Weight change:  Body mass index is 24.95 kg/m.   Physical Exam: General exam: Pleasant, elderly Caucasian female.  Propped up in bed.  Not in distress at rest Skin: No rashes, lesions or ulcers. HEENT: Atraumatic, normocephalic, no obvious bleeding Lungs: Clear to auscultation bilaterally CVS: Regular rate and rhythm, no murmur GI/Abd soft, nontender, nondistended, bowel sound present CNS: Alert, awake, oriented x3 Psychiatry: Mood appropriate Extremities: No pedal edema, no calf tenderness  Data Review: I have personally reviewed the laboratory data and studies available.  F/u labs ordered Unresulted Labs (From admission, onward)     Start     Ordered   07/08/21 2248  Na and K (sodium & potassium), rand urine  Add-on,   AD        07/08/21 2247   07/08/21 2248  Osmolality, urine  Add-on,   AD        07/08/21 2247   Unscheduled  CBC with Differential/Platelet  Daily,   R      07/09/21 1410   Unscheduled  Basic metabolic panel  Daily,    R      07/09/21 1410            Signed, Terrilee Croak, MD [facility] 07/09/2021

## 2021-07-09 NOTE — Progress Notes (Signed)
PHARMACIST - PHYSICIAN COMMUNICATION  CONCERNING:  Enoxaparin (Lovenox) for DVT Prophylaxis    RECOMMENDATION: Patient was prescribed enoxaprin 40mg  q24 hours for VTE prophylaxis.   Filed Weights   07/09/21 0300  Weight: 64.9 kg (143 lb 1.6 oz)    Body mass index is 24.95 kg/m.  Estimated Creatinine Clearance: 14.1 mL/min (A) (by C-G formula based on SCr of 2.86 mg/dL (H)).  Patient is candidate for enoxaparin 30mg  every 24 hours based on CrCl <12ml/min or Weight <45kg  DESCRIPTION: Pharmacy has adjusted enoxaparin dose per [facility] policy.  Patient is now receiving enoxaparin 30 mg every 24 hours    Renda Rolls, PharmD, [facility] 07/09/2021 3:50 AM

## 2021-07-09 NOTE — ED Notes (Signed)
Pt moved to hospital bed at this time.

## 2021-07-10 LAB — BASIC METABOLIC PANEL
Anion gap: 8 (ref 5–15)
BUN: 33 mg/dL — ABNORMAL HIGH (ref 8–23)
CO2: 20 mmol/L — ABNORMAL LOW (ref 22–32)
Calcium: 8.7 mg/dL — ABNORMAL LOW (ref 8.9–10.3)
Chloride: 110 mmol/L (ref 98–111)
Creatinine, Ser: 2.35 mg/dL — ABNORMAL HIGH (ref 0.44–1.00)
GFR, Estimated: 20 mL/min — ABNORMAL LOW (ref >60)
Glucose, Bld: 90 mg/dL (ref 70–99)
Potassium: 3.5 mmol/L (ref 3.5–5.1)
Sodium: 138 mmol/L (ref 135–145)

## 2021-07-10 LAB — CBC WITH DIFFERENTIAL/PLATELET
Abs Immature Granulocytes: 0.01 10*3/uL (ref 0.00–0.07)
Basophils Absolute: 0 10*3/uL (ref 0.0–0.1)
Basophils Relative: 1 %
Eosinophils Absolute: 0.2 10*3/uL (ref 0.0–0.5)
Eosinophils Relative: 5 %
HCT: 24.5 % — ABNORMAL LOW (ref 36.0–46.0)
Hemoglobin: 8.4 g/dL — ABNORMAL LOW (ref 12.0–15.0)
Immature Granulocytes: 0 %
Lymphocytes Relative: 19 %
Lymphs Abs: 0.7 10*3/uL (ref 0.7–4.0)
MCH: 33.7 pg (ref 26.0–34.0)
MCHC: 34.3 g/dL (ref 30.0–36.0)
MCV: 98.4 fL (ref 80.0–100.0)
Monocytes Absolute: 0.7 10*3/uL (ref 0.1–1.0)
Monocytes Relative: 17 %
Neutro Abs: 2.3 10*3/uL (ref 1.7–7.7)
Neutrophils Relative %: 58 %
Platelets: 142 10*3/uL — ABNORMAL LOW (ref 150–400)
RBC: 2.49 MIL/uL — ABNORMAL LOW (ref 3.87–5.11)
RDW: 13.3 % (ref 11.5–15.5)
WBC: 3.9 10*3/uL — ABNORMAL LOW (ref 4.0–10.5)
nRBC: 0 % (ref 0.0–0.2)

## 2021-07-10 MED ORDER — CALCIUM CITRATE-VITAMIN D 500-12.5 MG-MCG PO CHEW
CHEWABLE_TABLET | Freq: Every day | ORAL | Status: DC
  Filled 2021-07-10 (×2): qty 1

## 2021-07-10 NOTE — Progress Notes (Signed)
PROGRESS NOTE  Aimee Oliver  DOB: Dec 01, 1940  PCP: Sallee Lange, NP DTO:[id number]  DOA: 07/08/2021  LOS: 2 days  Hospital Day: 3  Chief Complaint  Patient presents with   Abnormal Lab    Brief narrative: Aimee Oliver is a 80 y.o. female with PMH significant for HTN, HLD, vestibular schwannoma. Patient presented to the ED on 12/21 with complaint acute onset of worsening dizziness and hyponatremia. She was seen by her PCP on 11/30 and was noted to have elevated creatinine, low sodium level and hence referred to the ED. Patient reports that recently she had a salivary gland infection and was treated with antibiotics and prednisone which she completed the course.  However she has not felt completely better.  She admits that she has not been eating adequately and probably not drinking of water either.  Lately she has been getting more dizzy on standing up.  In the ED, patient was hemodynamically stable. Labs with sodium low at 124, potassium low at 3.3, creatinine elevated to 2.86, AST/ALT elevated 181/106. 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  Patient was started on IV normal saline and admitted to hospitalist service  Subjective: Patient was seen and examined this morning.   Sitting up at the edge of the bed.  Not in distress.  No longer feeling dizzy.  Assessment/Plan: Acute worsening of dizziness Known history of left vestibular schwannoma  -Patient has chronic intermittent dizziness related to vestibular schwannoma.  She recently completed a course of antibiotics and prednisone for salivary gland infection but did not feel completely recovered.  She states she has not been eating enough or drinking enough fluid.  She has also been feeling more dizzy on standing up.  She probably has orthostatic hypotension respirating more dizziness than before.   -Orthostatic vital  signs checked today with drop in blood pressure: Sitting up 106/71, standing up 129/79.   -Currently on IV fluid.  Not feeling dizzy today.  Continue as needed Antivert.  Acute hyponatremia -Sodium level was low at 124 at presentation, sedated with elevated creatinine and orthostatic hypotension.  Hyponatremia is likely due to poor solute intake. -It improved with IV fluid. -Continue to monitor sodium level. Recent Labs  Lab 07/08/21 1548 07/09/21 0534 07/10/21 0618  NA 124* 135 138   AKI on CKD 4 -Present with a creatinine elevated to 2.86.   -On Care Everywhere, I noted that her creatinine was elevated 1.9 and GFR low at 25 in May 2022.   -Creatinine improving with IV hydration.  Not down to baseline yet.  Continue to monitor. Recent Labs    07/08/21 1548 07/09/21 0534 07/10/21 0618  BUN 49* 44* 33*  CREATININE 2.86* 2.60* 2.35*   Anemia -Hemoglobin low between 8 and 9.  No active bleeding.  Reports that last colonoscopy several years ago was normal.  Obtain anemia panel. Recent Labs    07/08/21 1548 07/09/21 0534 07/10/21 0618  HGB 10.1* 8.7* 8.4*  MCV 97.4 97.0 98.4    GERD. -Continue PPI therapy.  Essential hypertension. -Continue Toprol-XL.  Dyslipidemia. -Continue statin .  Urinary incontinence -Oxybutynin 3 times daily   Mobility: Encourage ambulation Living condition: Lives at home with significant other Goals of care:   Code Status: Full Code  Nutritional status: Body mass index is 24.95 kg/m.     Diet:  Diet Order  Diet regular Room service appropriate? Yes; Fluid consistency: Thin  Diet effective now                  DVT prophylaxis:  enoxaparin (LOVENOX) injection 30 mg Start: 07/09/21 0800   Antimicrobials: None Fluid: Continue normal saline at 75 mill per hour Consultants: None Family Communication: None at bedside  Status is: Inpatient  Remains inpatient appropriate because: Needs continued IV  hydration.  Dispo: The patient is from: Home              Anticipated d/c is to: Home in 1 to 2 days              Patient currently is not medically stable to d/c.   Difficult to place patient No     Infusions:   sodium chloride 75 mL/hr at 07/09/21 1636     Scheduled Meds:  acidophilus  1 capsule Oral Daily   Calcium Citrate-Vitamin D   Oral Daily   enoxaparin (LOVENOX) injection  30 mg Subcutaneous Q24H   metoprolol succinate  25 mg Oral QHS   multivitamin with minerals  1 tablet Oral Daily   oxybutynin  5 mg Oral TID   pantoprazole  40 mg Oral Daily   rosuvastatin  40 mg Oral QHS    PRN meds: acetaminophen **OR** acetaminophen, magnesium hydroxide, meclizine, ondansetron **OR** ondansetron (ZOFRAN) IV, traZODone   Antimicrobials: Anti-infectives (From admission, onward)    None       Objective: Vitals:   07/10/21 0525 07/10/21 0742  BP: 120/68 127/71  Pulse: 67 66  Resp: 18 15  Temp: 97.8 F (36.6 C) 98.1 F (36.7 C)  SpO2: 98% 100%    Intake/Output Summary (Last 24 hours) at 07/10/2021 0753 Last data filed at 07/10/2021 0430 Gross per 24 hour  Intake 1879.34 ml  Output --  Net 1879.34 ml   Filed Weights   07/09/21 0300  Weight: 64.9 kg   Weight change:  Body mass index is 24.95 kg/m.   Physical Exam: General exam: Pleasant, elderly Caucasian female.  Propped up in bed.  Not in distress at rest Skin: No rashes, lesions or ulcers. HEENT: Atraumatic, normocephalic, no obvious bleeding Lungs: Clear to auscultation bilaterally CVS: Regular rate and rhythm, no murmur GI/Abd soft, nontender, nondistended, bowel sound present CNS: Alert, awake, oriented x3 Psychiatry: Mood appropriate Extremities: No pedal edema, no calf tenderness  Data Review: I have personally reviewed the laboratory data and studies available.  F/u labs ordered Unresulted Labs (From admission, onward)     Start     Ordered   07/11/21 0500  Vitamin B12  (Anemia Panel  (PNL))  Tomorrow morning,   R       Question:  Specimen collection method  Answer:  Lab=Lab collect   07/10/21 0752   07/11/21 0500  Folate  (Anemia Panel (PNL))  Tomorrow morning,   R       Question:  Specimen collection method  Answer:  Lab=Lab collect   07/10/21 0752   07/11/21 0500  Iron and TIBC  (Anemia Panel (PNL))  Tomorrow morning,   R       Question:  Specimen collection method  Answer:  Lab=Lab collect   07/10/21 0752   07/11/21 0500  Ferritin  (Anemia Panel (PNL))  Tomorrow morning,   R       Question:  Specimen collection method  Answer:  Lab=Lab collect   07/10/21 0752   07/11/21 0500  Reticulocytes  (Anemia  Panel (PNL))  Tomorrow morning,   R       Question:  Specimen collection method  Answer:  Lab=Lab collect   07/10/21 0752   07/10/21 0500  CBC with Differential/Platelet  Daily,   STAT      07/09/21 1410   07/10/21 6659  Basic metabolic panel  Daily,   STAT      07/09/21 1410   07/08/21 2248  Na and K (sodium & potassium), rand urine  Add-on,   AD        07/08/21 2247   07/08/21 2248  Osmolality, urine  Add-on,   AD        07/08/21 2247            Signed, Terrilee Croak, MD [facility] 07/10/2021

## 2021-07-11 LAB — CBC WITH DIFFERENTIAL/PLATELET
Abs Immature Granulocytes: 0.01 10*3/uL (ref 0.00–0.07)
Basophils Absolute: 0 10*3/uL (ref 0.0–0.1)
Basophils Relative: 1 %
Eosinophils Absolute: 0.2 10*3/uL (ref 0.0–0.5)
Eosinophils Relative: 5 %
HCT: 23.8 % — ABNORMAL LOW (ref 36.0–46.0)
Hemoglobin: 8.1 g/dL — ABNORMAL LOW (ref 12.0–15.0)
Immature Granulocytes: 0 %
Lymphocytes Relative: 18 %
Lymphs Abs: 0.8 10*3/uL (ref 0.7–4.0)
MCH: 33.5 pg (ref 26.0–34.0)
MCHC: 34 g/dL (ref 30.0–36.0)
MCV: 98.3 fL (ref 80.0–100.0)
Monocytes Absolute: 0.7 10*3/uL (ref 0.1–1.0)
Monocytes Relative: 17 %
Neutro Abs: 2.5 10*3/uL (ref 1.7–7.7)
Neutrophils Relative %: 59 %
Platelets: 133 10*3/uL — ABNORMAL LOW (ref 150–400)
RBC: 2.42 MIL/uL — ABNORMAL LOW (ref 3.87–5.11)
RDW: 13.5 % (ref 11.5–15.5)
WBC: 4.3 10*3/uL (ref 4.0–10.5)
nRBC: 0 % (ref 0.0–0.2)

## 2021-07-11 LAB — BASIC METABOLIC PANEL
Anion gap: 7 (ref 5–15)
BUN: 27 mg/dL — ABNORMAL HIGH (ref 8–23)
CO2: 18 mmol/L — ABNORMAL LOW (ref 22–32)
Calcium: 8.4 mg/dL — ABNORMAL LOW (ref 8.9–10.3)
Chloride: 114 mmol/L — ABNORMAL HIGH (ref 98–111)
Creatinine, Ser: 2.05 mg/dL — ABNORMAL HIGH (ref 0.44–1.00)
GFR, Estimated: 24 mL/min — ABNORMAL LOW (ref >60)
Glucose, Bld: 95 mg/dL (ref 70–99)
Potassium: 3 mmol/L — ABNORMAL LOW (ref 3.5–5.1)
Sodium: 139 mmol/L (ref 135–145)

## 2021-07-11 LAB — VITAMIN B12: Vitamin B-12: 637 pg/mL (ref 180–914)

## 2021-07-11 LAB — RETICULOCYTES
Immature Retic Fract: 10.6 % (ref 2.3–15.9)
RBC.: 2.4 MIL/uL — ABNORMAL LOW (ref 3.87–5.11)
Retic Count, Absolute: 31.4 10*3/uL (ref 19.0–186.0)
Retic Ct Pct: 1.3 % (ref 0.4–3.1)

## 2021-07-11 LAB — IRON AND TIBC
Iron: 46 ug/dL (ref 28–170)
Saturation Ratios: 24 % (ref 10.4–31.8)
TIBC: 195 ug/dL — ABNORMAL LOW (ref 250–450)
UIBC: 149 ug/dL

## 2021-07-11 LAB — FOLATE: Folate: 19.9 ng/mL (ref >5.9)

## 2021-07-11 LAB — FERRITIN: Ferritin: 187 ng/mL (ref 11–307)

## 2021-07-11 MED ORDER — MECLIZINE HCL 12.5 MG PO TABS: 12.5000 mg | ORAL_TABLET | Freq: Three times a day (TID) | ORAL | 0 refills | Status: AC | PRN

## 2021-07-11 MED ORDER — POTASSIUM CHLORIDE CRYS ER 20 MEQ PO TBCR
40.0000 meq | EXTENDED_RELEASE_TABLET | Freq: Once | ORAL | Status: AC
  Administered 2021-07-11: 08:00:00 40 meq via ORAL
  Filled 2021-07-11: qty 2

## 2021-07-11 NOTE — Discharge Summary (Signed)
Physician Discharge Summary  Aimee Oliver TWS:[id number] DOB: [birth year] DOA: 07/08/2021  PCP: Sallee Lange, NP  Admit date: 07/08/2021 Discharge date: 07/11/2021  Admitted From: Home Discharge disposition: Home   Code Status: Full Code   Discharge Diagnosis:   Principal Problem:   Hyponatremia    Chief Complaint  Patient presents with   Abnormal Lab    Brief narrative: Aimee Oliver is a 80 y.o. female with PMH significant for HTN, HLD, vestibular schwannoma. Patient presented to the ED on 12/21 with complaint acute onset of worsening dizziness and hyponatremia. She was seen by her PCP on 11/30 and was noted to have elevated creatinine, low sodium level and hence referred to the ED. Patient reports that recently she had a salivary gland infection and was treated with antibiotics and prednisone which she completed the course.  However she has not felt completely better.  She admits that she has not been eating adequately and probably not drinking of water either.  Lately she has been getting more dizzy on standing up.  In the ED, patient was hemodynamically stable. Labs with sodium low at 124, potassium low at 3.3, creatinine elevated to 2.86, AST/ALT elevated 181/106. 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  Patient was started on IV normal saline and admitted to hospitalist service  Subjective: Patient was seen and examined this morning.   Sitting up at the edge of the bed.  Not in distress.  No longer dizzy.  Feels better and ready to go home.  Hospital course: Acute worsening of dizziness Known history of left vestibular schwannoma  -Patient has chronic intermittent dizziness related to vestibular schwannoma.  She recently completed a course of antibiotics and prednisone for salivary gland infection but did not feel completely recovered.  She states she was not  eating enough or drinking enough fluid.  She was also been feeling more dizzy on standing up.  She probably had precipitating orthostatic hypotension making her dizziness frequent and severe..   -Orthostatic vital signs were positive on admission with drop in blood pressure: Sitting up 106/71, standing up 129/79.   -She was given adequate IV hydration.  No longer having orthostatic hypotension.  No longer dizzy.  As needed Antivert available as needed.    Acute hyponatremia -Sodium level was low at 124 at presentation, associated with elevated creatinine and orthostatic hypotension.  Hyponatremia is likely due to poor solute intake.  It improved with IV hydration. -Monitor sodium level with PCP in a week. Recent Labs  Lab 07/08/21 1548 07/09/21 0534 07/10/21 0618 07/11/21 0423  NA 124* 135 138 139   AKI on CKD 4 -Presented with a creatinine elevated to 2.86.   -On Care Everywhere, I noted that her creatinine was elevated 1.9 and GFR low at 25 in May 2022.   -Creatinine improving steadily with IV hydration.  2.05 today.  Continue oral hydration at home.  Follow-up with PCP in a week Recent Labs    07/08/21 1548 07/09/21 0534 07/10/21 0618 07/11/21 0423  BUN 49* 44* 33* 27*  CREATININE 2.86* 2.60* 2.35* 2.05*   Anemia -Hemoglobin low between 8 and 9.  No active bleeding.  Patient reports that last colonoscopy several years ago was normal.   -Anemia panel obtained today shows ferritin level normal, folate level normal.  Pending B12 level.  Patient denies having any bleeding episodes.  I  have advised her to watch for any dark stool or blood in the stool.  Follow-up with PCP. Recent Labs    07/08/21 1548 07/09/21 0534 07/10/21 0618 07/11/21 0423  HGB 10.1* 8.7* 8.4* 8.1*  MCV 97.4 97.0 98.4 98.3  FOLATE  --   --   --  19.9  FERRITIN  --   --   --  187  TIBC  --   --   --  195*  IRON  --   --   --  46  RETICCTPCT  --   --   --  1.3   GERD. -Continue PPI therapy.  Essential  hypertension. -Continue Toprol-XL.  Dyslipidemia. -Continue statin .  Urinary incontinence -Oxybutynin 3 times daily  Discharge Medications:   Allergies as of 07/11/2021   No Known Allergies      Medication List     TAKE these medications    acetaminophen 650 MG CR tablet Commonly known as: TYLENOL Take 1,300 mg by mouth 3 (three) times daily.   ACIDOPHILUS PO Take 1 capsule by mouth daily.   AZO CRANBERRY PO Take 1 tablet by mouth daily as needed (for UTI prevention).   CALTRATE 600+D PLUS PO Take 1 tablet by mouth daily.   meclizine 12.5 MG tablet Commonly known as: ANTIVERT Take 1 tablet (12.5 mg total) by mouth 3 (three) times daily as needed for up to 3 days for dizziness.   metoprolol succinate 50 MG 24 hr tablet Commonly known as: TOPROL-XL Take 25 mg by mouth at bedtime.   multivitamin with minerals Tabs tablet Take 1 tablet by mouth daily. One-A-Day Women's 50+   omeprazole 20 MG capsule Commonly known as: PRILOSEC Take 20 mg by mouth daily before breakfast.   OVER THE COUNTER MEDICATION Take 1 tablet by mouth 2 (two) times daily. PHYSIO OMEGA (a blend of DPA, EPA, & DHA FATTY ACIDS)   OVER THE COUNTER MEDICATION 1 tablet at bedtime. Applied nutrition Anti-Aging total body daily defense, Co Q10, Vitamin D3, Resveratrol, ginkgo, green tea, lutein and Omega-3 oils   oxybutynin 5 MG tablet Commonly known as: DITROPAN Take 5 mg by mouth 3 (three) times daily.   rosuvastatin 40 MG tablet Commonly known as: CRESTOR Take 40 mg by mouth at bedtime.        Wound care:     Discharge Instructions:   Discharge Instructions     Call MD for:  difficulty breathing, headache or visual disturbances   Complete by: As directed    Call MD for:  extreme fatigue   Complete by: As directed    Call MD for:  hives   Complete by: As directed    Call MD for:  persistant dizziness or light-headedness   Complete by: As directed    Call MD for:  persistant  nausea and vomiting   Complete by: As directed    Call MD for:  severe uncontrolled pain   Complete by: As directed    Call MD for:  temperature >100.4   Complete by: As directed    Diet general   Complete by: As directed    Discharge instructions   Complete by: As directed    General discharge instructions:  Follow with Primary MD Gauger, Victoriano Lain, NP in 7 days   Get CBC/BMP checked in next visit within 1 week by PCP or SNF MD. (We routinely change or add medications that can affect your baseline labs and fluid status, therefore we recommend that you  get the mentioned basic workup next visit with your PCP, your PCP may decide not to get them or add new tests based on their clinical decision)  On your next visit with your PCP, please get your medicines reviewed and adjusted.  Please request your PCP  to go over all hospital tests, procedures, radiology results at the follow up, please get all Hospital records sent to your PCP by signing hospital release before you go home.  Activity: As tolerated with Full fall precautions use walker/cane & assistance as needed  Avoid using any recreational substances like cigarette, tobacco, alcohol, or non-prescribed drug.  If you experience worsening of your admission symptoms, develop shortness of breath, life threatening emergency, suicidal or homicidal thoughts you must seek medical attention immediately by calling 911 or calling your MD immediately  if symptoms less severe.  You must read complete instructions/literature along with all the possible adverse reactions/side effects for all the medicines you take and that have been prescribed to you. Take any new medicine only after you have completely understood and accepted all the possible adverse reactions/side effects.   Do not drive, operate heavy machinery, perform activities at heights, swimming or participation in water activities or provide baby sitting services if your were admitted  for syncope or siezures until you have seen by Primary MD or a Neurologist and advised to do so again.  Do not drive when taking Pain medications.  Do not take more than prescribed Pain, Sleep and Anxiety Medications  Wear Seat belts while driving.  Please note You were cared for by a hospitalist during your hospital stay. If you have any questions about your discharge medications or the care you received while you were in the hospital after you are discharged, you can call the unit and asked to speak with the hospitalist on call if the hospitalist that took care of you is not available. Once you are discharged, your primary care physician will handle any further medical issues. Please note that NO REFILLS for any discharge medications will be authorized once you are discharged, as it is imperative that you return to your primary care physician (or establish a relationship with a primary care physician if you do not have one) for your aftercare needs so that they can reassess your need for medications and monitor your lab values.   Increase activity slowly   Complete by: As directed        Follow ups:    Follow-up Information     Gauger, Victoriano Lain, NP Follow up.   Specialty: Internal Medicine Contact information: [place] [address] [phone]                 Discharge Exam:   Vitals:   07/10/21 1948 07/11/21 0004 07/11/21 0452 07/11/21 0755  BP: (!) 137/59 125/62 124/67 (!) 126/58  Pulse: 83 80 73 75  Resp: 18 18 18 14   Temp: 98.7 F (37.1 C) 98 F (36.7 C) 98.1 F (36.7 C) 98.2 F (36.8 C)  TempSrc: Oral Oral Oral Oral  SpO2: 98% 100% 99% 98%  Weight:      Height:        Body mass index is 24.95 kg/m.  General exam: Pleasant, elderly Caucasian female.  Propped up in bed.  Not in distress at rest. Skin: No rashes, lesions or ulcers. HEENT: Atraumatic, normocephalic, no obvious bleeding Lungs: Clear to auscultation bilaterally CVS:  Regular rate and rhythm, no murmur GI/Abd soft, nontender, nondistended,  bowel sound present CNS: Alert, awake abound x3 Psychiatry: Mood appropriate Extremities: No pedal edema, no calf tenderness  Time coordinating discharge: 35 minutes   The results of significant diagnostics from this hospitalization (including imaging, microbiology, ancillary and laboratory) are listed below for reference.    Procedures and Diagnostic Studies:   No results found.   Labs:   Basic Metabolic Panel: Recent Labs  Lab 07/08/21 1548 07/09/21 0534 07/10/21 0618 07/11/21 0423  NA 124* 135 138 139  K 3.3* 3.3* 3.5 3.0*  CL 93* 107 110 114*  CO2 20* 18* 20* 18*  GLUCOSE 90 77 90 95  BUN 49* 44* 33* 27*  CREATININE 2.86* 2.60* 2.35* 2.05*  CALCIUM 9.1 8.6* 8.7* 8.4*  MG 2.1  --   --   --    GFR Estimated Creatinine Clearance: 19.7 mL/min (A) (by C-G formula based on SCr of 2.05 mg/dL (H)). Liver Function Tests: Recent Labs  Lab 07/08/21 1548  AST 181*  ALT 106*  ALKPHOS 68  BILITOT 1.1  PROT 7.2  ALBUMIN 3.9   No results for input(s): LIPASE, AMYLASE in the last 168 hours. No results for input(s): AMMONIA in the last 168 hours. Coagulation profile No results for input(s): INR, PROTIME in the last 168 hours.  CBC: Recent Labs  Lab 07/08/21 1548 07/09/21 0534 07/10/21 0618 07/11/21 0423  WBC 6.9 4.6 3.9* 4.3  NEUTROABS 5.1  --  2.3 2.5  HGB 10.1* 8.7* 8.4* 8.1*  HCT 29.4* 26.0* 24.5* 23.8*  MCV 97.4 97.0 98.4 98.3  PLT 160 120* 142* 133*   Cardiac Enzymes: No results for input(s): CKTOTAL, CKMB, CKMBINDEX, TROPONINI in the last 168 hours. BNP: Invalid input(s): POCBNP CBG: No results for input(s): GLUCAP in the last 168 hours. D-Dimer No results for input(s): DDIMER in the last 72 hours. Hgb A1c No results for input(s): HGBA1C in the last 72 hours. Lipid Profile No results for input(s): CHOL, HDL, LDLCALC, TRIG, CHOLHDL, LDLDIRECT in the last 72 hours. Thyroid  function studies No results for input(s): TSH, T4TOTAL, T3FREE, THYROIDAB in the last 72 hours.  Invalid input(s): FREET3 Anemia work up Recent Labs    07/11/21 0423  FOLATE 19.9  FERRITIN 187  TIBC 195*  IRON 46  RETICCTPCT 1.3   Microbiology Recent Results (from the past 240 hour(s))  Resp Panel by RT-PCR (Flu A&B, Covid) Nasopharyngeal Swab     Status: None   Collection Time: 07/08/21 11:13 PM   Specimen: Nasopharyngeal Swab; Nasopharyngeal(NP) swabs in vial transport medium  Result Value Ref Range Status   SARS Coronavirus 2 by RT PCR NEGATIVE NEGATIVE Final    Comment: (NOTE) SARS-CoV-2 target nucleic acids are NOT DETECTED.  The SARS-CoV-2 RNA is generally detectable in upper respiratory specimens during the acute phase of infection. The lowest concentration of SARS-CoV-2 viral copies this assay can detect is 138 copies/mL. A negative result does not preclude SARS-Cov-2 infection and should not be used as the sole basis for treatment or other patient management decisions. A negative result may occur with  improper specimen collection/handling, submission of specimen other than nasopharyngeal swab, presence of viral mutation(s) within the areas targeted by this assay, and inadequate number of viral copies(<138 copies/mL). A negative result must be combined with clinical observations, patient history, and epidemiological information. The expected result is Negative.  Fact Sheet for Patients:  EntrepreneurPulse.com.au  Fact Sheet for Healthcare Providers:  IncredibleEmployment.be  This test is no t yet approved or cleared by the [place]  FDA and  has been authorized for detection and/or diagnosis of SARS-CoV-2 by FDA under an Emergency Use Authorization (EUA). This EUA will remain  in effect (meaning this test can be used) for the duration of the COVID-19 declaration under Section 564(b)(1) of the Act, 21 U.S.C.section  360bbb-3(b)(1), unless the authorization is terminated  or revoked sooner.       Influenza A by PCR NEGATIVE NEGATIVE Final   Influenza B by PCR NEGATIVE NEGATIVE Final    Comment: (NOTE) The Xpert Xpress SARS-CoV-2/FLU/RSV plus assay is intended as an aid in the diagnosis of influenza from Nasopharyngeal swab specimens and should not be used as a sole basis for treatment. Nasal washings and aspirates are unacceptable for Xpert Xpress SARS-CoV-2/FLU/RSV testing.  Fact Sheet for Patients: EntrepreneurPulse.com.au  Fact Sheet for Healthcare Providers: IncredibleEmployment.be  This test is not yet approved or cleared by the [place] FDA and has been authorized for detection and/or diagnosis of SARS-CoV-2 by FDA under an Emergency Use Authorization (EUA). This EUA will remain in effect (meaning this test can be used) for the duration of the COVID-19 declaration under Section 564(b)(1) of the Act, 21 U.S.C. section 360bbb-3(b)(1), unless the authorization is terminated or revoked.  Performed at [facility], [address] [place], [place] [address]      Signed: Terrilee Croak  [facility] 07/11/2021, 9:30 AM

## 2021-09-13 ENCOUNTER — Other Ambulatory Visit: Payer: Self-pay | Admitting: Nephrology

## 2021-09-13 DIAGNOSIS — N179 Acute kidney failure, unspecified: Secondary | ICD-10-CM

## 2021-09-13 DIAGNOSIS — N184 Chronic kidney disease, stage 4 (severe): Secondary | ICD-10-CM

## 2021-09-20 ENCOUNTER — Other Ambulatory Visit: Payer: Self-pay

## 2021-09-20 ENCOUNTER — Ambulatory Visit
Admission: RE | Admit: 2021-09-20 | Discharge: 2021-09-20 | Disposition: A | Discharge status: Home / Self Care | Payer: Medicare HMO | Source: Ambulatory Visit | Attending: Nephrology | Admitting: Nephrology

## 2021-09-20 DIAGNOSIS — N184 Chronic kidney disease, stage 4 (severe): Secondary | ICD-10-CM | POA: Diagnosis present

## 2021-09-20 DIAGNOSIS — N179 Acute kidney failure, unspecified: Secondary | ICD-10-CM | POA: Diagnosis not present

## 2021-10-16 ENCOUNTER — Emergency Department: Payer: Medicare HMO

## 2021-10-16 ENCOUNTER — Other Ambulatory Visit: Payer: Self-pay

## 2021-10-16 ENCOUNTER — Inpatient Hospital Stay
Admission: EM | Admit: 2021-10-16 | Discharge: 2021-10-19 | DRG: 026 | Disposition: A | Discharge status: Home Health | Payer: Medicare HMO | Attending: Internal Medicine | Admitting: Internal Medicine

## 2021-10-16 ENCOUNTER — Encounter: Payer: Self-pay | Admitting: Emergency Medicine

## 2021-10-16 DIAGNOSIS — Z96651 Presence of right artificial knee joint: Secondary | ICD-10-CM | POA: Diagnosis present

## 2021-10-16 DIAGNOSIS — I10 Essential (primary) hypertension: Secondary | ICD-10-CM | POA: Diagnosis present

## 2021-10-16 DIAGNOSIS — D333 Benign neoplasm of cranial nerves: Secondary | ICD-10-CM | POA: Diagnosis present

## 2021-10-16 DIAGNOSIS — Z9882 Breast implant status: Secondary | ICD-10-CM | POA: Diagnosis not present

## 2021-10-16 DIAGNOSIS — E871 Hypo-osmolality and hyponatremia: Secondary | ICD-10-CM | POA: Diagnosis present

## 2021-10-16 DIAGNOSIS — R4182 Altered mental status, unspecified: Secondary | ICD-10-CM

## 2021-10-16 DIAGNOSIS — N3281 Overactive bladder: Secondary | ICD-10-CM | POA: Diagnosis present

## 2021-10-16 DIAGNOSIS — N390 Urinary tract infection, site not specified: Secondary | ICD-10-CM | POA: Diagnosis present

## 2021-10-16 DIAGNOSIS — Z90710 Acquired absence of both cervix and uterus: Secondary | ICD-10-CM

## 2021-10-16 DIAGNOSIS — Z9842 Cataract extraction status, left eye: Secondary | ICD-10-CM

## 2021-10-16 DIAGNOSIS — N184 Chronic kidney disease, stage 4 (severe): Secondary | ICD-10-CM | POA: Diagnosis present

## 2021-10-16 DIAGNOSIS — Z20822 Contact with and (suspected) exposure to covid-19: Secondary | ICD-10-CM | POA: Diagnosis present

## 2021-10-16 DIAGNOSIS — E7800 Pure hypercholesterolemia, unspecified: Secondary | ICD-10-CM | POA: Diagnosis present

## 2021-10-16 DIAGNOSIS — S065XAA Traumatic subdural hemorrhage with loss of consciousness status unknown, initial encounter: Secondary | ICD-10-CM | POA: Diagnosis present

## 2021-10-16 DIAGNOSIS — Z7982 Long term (current) use of aspirin: Secondary | ICD-10-CM | POA: Diagnosis not present

## 2021-10-16 DIAGNOSIS — I6200 Nontraumatic subdural hemorrhage, unspecified: Principal | ICD-10-CM

## 2021-10-16 DIAGNOSIS — I609 Nontraumatic subarachnoid hemorrhage, unspecified: Secondary | ICD-10-CM | POA: Diagnosis not present

## 2021-10-16 DIAGNOSIS — Z79899 Other long term (current) drug therapy: Secondary | ICD-10-CM

## 2021-10-16 DIAGNOSIS — R441 Visual hallucinations: Secondary | ICD-10-CM | POA: Diagnosis present

## 2021-10-16 DIAGNOSIS — S066XAA Traumatic subarachnoid hemorrhage with loss of consciousness status unknown, initial encounter: Secondary | ICD-10-CM | POA: Diagnosis present

## 2021-10-16 DIAGNOSIS — W06XXXA Fall from bed, initial encounter: Secondary | ICD-10-CM | POA: Diagnosis present

## 2021-10-16 DIAGNOSIS — Z96641 Presence of right artificial hip joint: Secondary | ICD-10-CM | POA: Diagnosis present

## 2021-10-16 DIAGNOSIS — K219 Gastro-esophageal reflux disease without esophagitis: Secondary | ICD-10-CM | POA: Diagnosis present

## 2021-10-16 DIAGNOSIS — Z800 Family history of malignant neoplasm of digestive organs: Secondary | ICD-10-CM | POA: Diagnosis not present

## 2021-10-16 DIAGNOSIS — I129 Hypertensive chronic kidney disease with stage 1 through stage 4 chronic kidney disease, or unspecified chronic kidney disease: Secondary | ICD-10-CM | POA: Diagnosis present

## 2021-10-16 DIAGNOSIS — H9190 Unspecified hearing loss, unspecified ear: Secondary | ICD-10-CM | POA: Diagnosis present

## 2021-10-16 DIAGNOSIS — Z806 Family history of leukemia: Secondary | ICD-10-CM | POA: Diagnosis not present

## 2021-10-16 DIAGNOSIS — R2681 Unsteadiness on feet: Secondary | ICD-10-CM | POA: Diagnosis present

## 2021-10-16 LAB — CBC WITH DIFFERENTIAL/PLATELET
Abs Immature Granulocytes: 0.1 10*3/uL — ABNORMAL HIGH (ref 0.00–0.07)
Basophils Absolute: 0 10*3/uL (ref 0.0–0.1)
Basophils Relative: 0 %
Eosinophils Absolute: 0.1 10*3/uL (ref 0.0–0.5)
Eosinophils Relative: 1 %
HCT: 26.1 % — ABNORMAL LOW (ref 36.0–46.0)
Hemoglobin: 8.3 g/dL — ABNORMAL LOW (ref 12.0–15.0)
Immature Granulocytes: 1 %
Lymphocytes Relative: 11 %
Lymphs Abs: 0.9 10*3/uL (ref 0.7–4.0)
MCH: 33.6 pg (ref 26.0–34.0)
MCHC: 31.8 g/dL (ref 30.0–36.0)
MCV: 105.7 fL — ABNORMAL HIGH (ref 80.0–100.0)
Monocytes Absolute: 1.1 10*3/uL — ABNORMAL HIGH (ref 0.1–1.0)
Monocytes Relative: 12 %
Neutro Abs: 6.3 10*3/uL (ref 1.7–7.7)
Neutrophils Relative %: 75 %
Platelets: 258 10*3/uL (ref 150–400)
RBC: 2.47 MIL/uL — ABNORMAL LOW (ref 3.87–5.11)
RDW: 13.9 % (ref 11.5–15.5)
WBC: 8.5 10*3/uL (ref 4.0–10.5)
nRBC: 0 % (ref 0.0–0.2)

## 2021-10-16 LAB — COMPREHENSIVE METABOLIC PANEL
ALT: 31 U/L (ref 0–44)
AST: 47 U/L — ABNORMAL HIGH (ref 15–41)
Albumin: 4.1 g/dL (ref 3.5–5.0)
Alkaline Phosphatase: 58 U/L (ref 38–126)
Anion gap: 10 (ref 5–15)
BUN: 33 mg/dL — ABNORMAL HIGH (ref 8–23)
CO2: 20 mmol/L — ABNORMAL LOW (ref 22–32)
Calcium: 9.5 mg/dL (ref 8.9–10.3)
Chloride: 102 mmol/L (ref 98–111)
Creatinine, Ser: 1.56 mg/dL — ABNORMAL HIGH (ref 0.44–1.00)
GFR, Estimated: 33 mL/min — ABNORMAL LOW (ref >60)
Glucose, Bld: 123 mg/dL — ABNORMAL HIGH (ref 70–99)
Potassium: 3.5 mmol/L (ref 3.5–5.1)
Sodium: 132 mmol/L — ABNORMAL LOW (ref 135–145)
Total Bilirubin: 1 mg/dL (ref 0.3–1.2)
Total Protein: 7.4 g/dL (ref 6.5–8.1)

## 2021-10-16 LAB — PROTIME-INR
INR: 0.9 (ref 0.8–1.2)
Prothrombin Time: 12.1 seconds (ref 11.4–15.2)

## 2021-10-16 LAB — URINALYSIS, COMPLETE (UACMP) WITH MICROSCOPIC
Bilirubin Urine: NEGATIVE
Glucose, UA: NEGATIVE mg/dL
Ketones, ur: 5 mg/dL — AB
Nitrite: NEGATIVE
Protein, ur: 100 mg/dL — AB
Specific Gravity, Urine: 1.013 (ref 1.005–1.030)
pH: 5 (ref 5.0–8.0)

## 2021-10-16 LAB — TSH: TSH: 4.061 u[IU]/mL (ref 0.350–4.500)

## 2021-10-16 LAB — RESP PANEL BY RT-PCR (FLU A&B, COVID) ARPGX2
Influenza A by PCR: NEGATIVE
Influenza B by PCR: NEGATIVE
SARS Coronavirus 2 by RT PCR: NEGATIVE

## 2021-10-16 LAB — APTT: aPTT: 23 seconds — ABNORMAL LOW (ref 24–36)

## 2021-10-16 MED ORDER — SODIUM CHLORIDE 0.9 % IV SOLN: INTRAVENOUS | Status: DC

## 2021-10-16 MED ORDER — SODIUM CHLORIDE 0.9 % IV BOLUS
1000.0000 mL | Freq: Once | INTRAVENOUS | Status: AC
  Administered 2021-10-16: 1000 mL via INTRAVENOUS

## 2021-10-16 NOTE — ED Triage Notes (Signed)
Pt to ED via POV, pt's visitor reports pt fell approx 1 week ago, reports hx of vertigo. Pt's visitor reports pt saw PCP regarding fall and went to another doctor, unable to state which one, to have balance checked. Pt's significant other reports pt has been altered x 3-4 days, reports decreased appetite, and talking out of her mind, pt's significant other reports that patient has been hallucinating, seeing her grandchildren when they are not there.  ?

## 2021-10-16 NOTE — ED Provider Notes (Signed)
? ?[facility] ?Provider Note ? ? ? Event Date/Time  ? First MD Initiated Contact with Patient 10/16/21 2123   ?  (approximate) ? ? ?History  ? ?Fall and Altered Mental Status ? ? ?HPI ? ?Aimee Oliver is a 81 y.o. female  with PMH significant for HTN, HLD, GERD, CKD, vestibular schwannoma who presents accompanied by his longtime partner for assessment of altered mental status.  Patient and partner that the patient fell a couple days ago although they are not exactly sure when this happened.  Patient states she went to her "audiologist" after this fall but did not otherwise get evaluated.  Per partner at bedside she has been getting more confused over last couple days and has been hallucinating her grandkids in her apartment.  Patient states she does not experience problems with her memory but denies any other acute concerns.  She does not remember the details of the fall.  She denies any headache, earache, sore throat, cough, fever, vomiting, diarrhea, rash or urinary symptoms.  Per family at bedside they are not aware of any new medication changes and I have low suspicion for toxic ingestion or alcohol. ? ?  ?I reviewed prior records including recent PCP visit from 2/28. ? ?Physical Exam  ?Triage Vital Signs: ?ED Triage Vitals [10/16/21 2123]  ?Enc Vitals Group  ?   BP   ?   Pulse   ?   Resp   ?   Temp   ?   Temp src   ?   SpO2   ?   Weight 143 lb 1.3 oz (64.9 kg)  ?   Height 5' 3.5" (1.613 m)  ?   Head Circumference   ?   Peak Flow   ?   Pain Score   ?   Pain Loc   ?   Pain Edu?   ?   Excl. in [place]?   ? ? ?Most recent vital signs: ?Vitals:  ? 10/16/21 2124  ?BP: (!) 166/86  ?Pulse: 91  ?Resp: 18  ?Temp: 98.3 ?F (36.8 ?C)  ?SpO2: 100%  ? ? ?General: Awake, no distress.  ?CV:  Good peripheral perfusion.  2+ radial pulses. ?Resp:  Normal effort.  Clear bilaterally. ?Abd:  No distention.  Soft throughout. ?Other:  Patient is not oriented.  Left pupil is very slightly larger than the right and  minimally reactive to light compared to the right.  No finger dysmetria.  No pronator drift.  Symmetric strength in the extremities.  There is a little bit of ecchymosis over the lateral aspect of the C-spine over the right trapezius but no midline C-spine tenderness or any tenderness over the T or L-spine. ? ? ?ED Results / Procedures / Treatments  ?Labs ?(all labs ordered are listed, but only abnormal results are displayed) ?Labs Reviewed  ?CBC WITH DIFFERENTIAL/PLATELET - Abnormal; Notable for the following components:  ?    Result Value  ? RBC 2.47 (*)   ? Hemoglobin 8.3 (*)   ? HCT 26.1 (*)   ? MCV 105.7 (*)   ? Monocytes Absolute 1.1 (*)   ? Abs Immature Granulocytes 0.10 (*)   ? All other components within normal limits  ?COMPREHENSIVE METABOLIC PANEL - Abnormal; Notable for the following components:  ? Sodium 132 (*)   ? CO2 20 (*)   ? Glucose, Bld 123 (*)   ? BUN 33 (*)   ? Creatinine, Ser 1.56 (*)   ? AST 47 (*)   ?  GFR, Estimated 33 (*)   ? All other components within normal limits  ?URINALYSIS, COMPLETE (UACMP) WITH MICROSCOPIC - Abnormal; Notable for the following components:  ? Color, Urine YELLOW (*)   ? APPearance HAZY (*)   ? Hgb urine dipstick MODERATE (*)   ? Ketones, ur 5 (*)   ? Protein, ur 100 (*)   ? Leukocytes,Ua TRACE (*)   ? Bacteria, UA FEW (*)   ? All other components within normal limits  ?RESP PANEL BY RT-PCR (FLU A&B, COVID) ARPGX2  ?TSH  ?PROTIME-INR  ?APTT  ? ? ? ?EKG ? ?EKG is remarkable sinus rhythm with a ventricular rate of 88, normal axis, unremarkable intervals without evidence of acute ischemia or significant arrhythmia. ? ? ?RADIOLOGY ? ?CT head and C-spine, interpretation with evidence of bilateral subdural hemorrhages without evidence of clear acute ischemia or skull fracture or acute C-spine injury.  I reviewed and discussed with reading radiologist radiologist interpretation including bilateral subdural hemorrhages measuring up to 1.5 cm in diameter with acute component  on the left and associated 2 mm left to right midline shift as well as a trace acute left subarachnoid hemorrhage. ? ?PROCEDURES: ? ?Critical Care performed: Yes, see critical care procedure note(s) ? ?.1-3 Lead EKG Interpretation ?Performed by: Lucrezia Starch, MD ?Authorized by: Lucrezia Starch, MD  ? ?  Interpretation: normal   ?  ECG rate assessment: normal   ?  Rhythm: sinus rhythm   ?  Ectopy: none   ?  Conduction: normal   ?.Critical Care ?Performed by: Lucrezia Starch, MD ?Authorized by: Lucrezia Starch, MD  ? ?Critical care provider statement:  ?  Critical care time (minutes):  30 ?  Critical care was necessary to treat or prevent imminent or life-threatening deterioration of the following conditions:  CNS failure or compromise and trauma ?  Critical care was time spent personally by me on the following activities:  Development of treatment plan with patient or surrogate, discussions with consultants, evaluation of patient's response to treatment, examination of patient, ordering and review of laboratory studies, ordering and review of radiographic studies, ordering and performing treatments and interventions, pulse oximetry, re-evaluation of patient's condition and review of old charts ? ?The patient is on the cardiac monitor to evaluate for evidence of arrhythmia and/or significant heart rate changes. ? ? ?MEDICATIONS ORDERED IN ED: ?Medications - No data to display ? ? ?IMPRESSION / MDM / ASSESSMENT AND PLAN / ED COURSE  ?I reviewed the triage vital signs and the nursing notes. ?             ?               ? ?Differential diagnosis includes, but is not limited to, progression of schwannoma, subarachnoid hemorrhage from recent fall, CVA, metabolic derangements, acute cystitis and thyroid derangements. ? ?EKG is remarkable sinus rhythm with a ventricular rate of 88, normal axis, unremarkable intervals without evidence of acute ischemia or significant arrhythmia. ? ?CT head and C-spine, interpretation  with evidence of bilateral subdural hemorrhages without evidence of clear acute ischemia or skull fracture or acute C-spine injury.  I reviewed and discussed with reading radiologist radiologist interpretation including bilateral subdural hemorrhages measuring up to 1.5 cm in diameter with acute component on the left and associated 2 mm left to right midline shift as well as a trace acute left subarachnoid hemorrhage. ? ?CMP is remarkable for improved kidney function compared to 2 months ago with a creatinine  of 1.56 compared to 2.05 with a sodium of 132, bicarb of 20 with normal anion gap and no other significant electrolyte or metabolic derangements.  CBC without leukocytosis and hemoglobin of 8.3 compared to 8.13 months ago.  TSH is within normal limits.  UA has some protein, ketones and trace leukocyte esterase and a few bacteria but otherwise no convincing evidence of clinically significant cystitis at this time. ? ?Given concerns for subacute likely traumatic bilateral subdurals and trace subarachnoid I did consult with neurosurgery Dr. Lacinda Axon will come and evaluate patient. ? ?Review of records patient is only on daily ASA 81 mg but no other anticoagulation. ? ?Patient seen at bedside by Dr. Lacinda Axon.  He recommends hospitalist admission with plan for likely operative evacuation tomorrow.  I discussed this with admitting hospitalist will place admission orders. ? ?  ? ? ?FINAL CLINICAL IMPRESSION(S) / ED DIAGNOSES  ? ?Final diagnoses:  ?Subdural hemorrhage ([place])  ?Subarachnoid bleed ([place])  ?Altered mental status, unspecified altered mental status type  ? ? ? ?Rx / DC Orders  ? ?ED Discharge Orders   ? ? None  ? ?  ? ? ? ?Note:  This document was prepared using Dragon voice recognition software and may include unintentional dictation errors. ?  ?Lucrezia Starch, MD ?10/16/21 2338 ? ?

## 2021-10-17 ENCOUNTER — Inpatient Hospital Stay: Payer: Medicare HMO | Admitting: Anesthesiology

## 2021-10-17 ENCOUNTER — Encounter
Admission: EM | Disposition: A | Discharge status: Home Health | Payer: Self-pay | Source: Home / Self Care | Attending: Internal Medicine

## 2021-10-17 ENCOUNTER — Other Ambulatory Visit: Payer: Self-pay

## 2021-10-17 DIAGNOSIS — I10 Essential (primary) hypertension: Secondary | ICD-10-CM

## 2021-10-17 DIAGNOSIS — N184 Chronic kidney disease, stage 4 (severe): Secondary | ICD-10-CM

## 2021-10-17 DIAGNOSIS — K219 Gastro-esophageal reflux disease without esophagitis: Secondary | ICD-10-CM

## 2021-10-17 DIAGNOSIS — S065XAA Traumatic subdural hemorrhage with loss of consciousness status unknown, initial encounter: Secondary | ICD-10-CM

## 2021-10-17 DIAGNOSIS — I609 Nontraumatic subarachnoid hemorrhage, unspecified: Secondary | ICD-10-CM | POA: Diagnosis not present

## 2021-10-17 HISTORY — PX: BURR HOLE: SHX908

## 2021-10-17 LAB — COMPREHENSIVE METABOLIC PANEL
ALT: 27 U/L (ref 0–44)
AST: 42 U/L — ABNORMAL HIGH (ref 15–41)
Albumin: 3.3 g/dL — ABNORMAL LOW (ref 3.5–5.0)
Alkaline Phosphatase: 49 U/L (ref 38–126)
Anion gap: 7 (ref 5–15)
BUN: 30 mg/dL — ABNORMAL HIGH (ref 8–23)
CO2: 22 mmol/L (ref 22–32)
Calcium: 9.1 mg/dL (ref 8.9–10.3)
Chloride: 109 mmol/L (ref 98–111)
Creatinine, Ser: 1.42 mg/dL — ABNORMAL HIGH (ref 0.44–1.00)
GFR, Estimated: 37 mL/min — ABNORMAL LOW (ref >60)
Glucose, Bld: 104 mg/dL — ABNORMAL HIGH (ref 70–99)
Potassium: 4.3 mmol/L (ref 3.5–5.1)
Sodium: 138 mmol/L (ref 135–145)
Total Bilirubin: 0.8 mg/dL (ref 0.3–1.2)
Total Protein: 6.2 g/dL — ABNORMAL LOW (ref 6.5–8.1)

## 2021-10-17 LAB — CBC WITH DIFFERENTIAL/PLATELET
Abs Immature Granulocytes: 0.05 10*3/uL (ref 0.00–0.07)
Basophils Absolute: 0 10*3/uL (ref 0.0–0.1)
Basophils Relative: 0 %
Eosinophils Absolute: 0.1 10*3/uL (ref 0.0–0.5)
Eosinophils Relative: 2 %
HCT: 23.3 % — ABNORMAL LOW (ref 36.0–46.0)
Hemoglobin: 7.5 g/dL — ABNORMAL LOW (ref 12.0–15.0)
Immature Granulocytes: 1 %
Lymphocytes Relative: 12 %
Lymphs Abs: 0.8 10*3/uL (ref 0.7–4.0)
MCH: 33.6 pg (ref 26.0–34.0)
MCHC: 32.2 g/dL (ref 30.0–36.0)
MCV: 104.5 fL — ABNORMAL HIGH (ref 80.0–100.0)
Monocytes Absolute: 0.9 10*3/uL (ref 0.1–1.0)
Monocytes Relative: 14 %
Neutro Abs: 4.6 10*3/uL (ref 1.7–7.7)
Neutrophils Relative %: 71 %
Platelets: 240 10*3/uL (ref 150–400)
RBC: 2.23 MIL/uL — ABNORMAL LOW (ref 3.87–5.11)
RDW: 13.8 % (ref 11.5–15.5)
WBC: 6.5 10*3/uL (ref 4.0–10.5)
nRBC: 0 % (ref 0.0–0.2)

## 2021-10-17 LAB — IRON AND TIBC
Iron: 45 ug/dL (ref 28–170)
Saturation Ratios: 18 % (ref 10.4–31.8)
TIBC: 249 ug/dL — ABNORMAL LOW (ref 250–450)
UIBC: 204 ug/dL

## 2021-10-17 LAB — MAGNESIUM: Magnesium: 2.2 mg/dL (ref 1.7–2.4)

## 2021-10-17 LAB — GLUCOSE, CAPILLARY: Glucose-Capillary: 89 mg/dL (ref 70–99)

## 2021-10-17 LAB — MRSA NEXT GEN BY PCR, NASAL: MRSA by PCR Next Gen: NOT DETECTED

## 2021-10-17 LAB — VITAMIN B12: Vitamin B-12: 927 pg/mL — ABNORMAL HIGH (ref 180–914)

## 2021-10-17 LAB — FERRITIN: Ferritin: 147 ng/mL (ref 11–307)

## 2021-10-17 SURGERY — CREATION, CRANIAL BURR HOLE
Anesthesia: General | Laterality: Bilateral

## 2021-10-17 MED ORDER — ONDANSETRON HCL 4 MG/2ML IJ SOLN
INTRAMUSCULAR | Status: DC | PRN
  Administered 2021-10-17: 4 mg via INTRAVENOUS

## 2021-10-17 MED ORDER — ONDANSETRON HCL 4 MG/2ML IJ SOLN
4.0000 mg | Freq: Four times a day (QID) | INTRAMUSCULAR | Status: DC | PRN
Start: 2021-10-17 — End: 2021-10-19

## 2021-10-17 MED ORDER — EPHEDRINE SULFATE (PRESSORS) 50 MG/ML IJ SOLN
INTRAMUSCULAR | Status: DC | PRN
  Administered 2021-10-17 (×5): 5 mg via INTRAVENOUS

## 2021-10-17 MED ORDER — PROPOFOL 10 MG/ML IV BOLUS
INTRAVENOUS | Status: DC | PRN
  Administered 2021-10-17: 100 mg via INTRAVENOUS

## 2021-10-17 MED ORDER — ROSUVASTATIN CALCIUM 20 MG PO TABS
40.0000 mg | ORAL_TABLET | Freq: Every day | ORAL | Status: DC
  Administered 2021-10-17 – 2021-10-18 (×3): 40 mg via ORAL
  Filled 2021-10-17: qty 4
  Filled 2021-10-17 (×2): qty 2

## 2021-10-17 MED ORDER — 0.9 % SODIUM CHLORIDE (POUR BTL) OPTIME
TOPICAL | Status: DC | PRN
Start: 2021-10-17 — End: 2021-10-17
  Administered 2021-10-17: 2000 mL

## 2021-10-17 MED ORDER — OXYCODONE HCL 5 MG PO TABS
2.5000 mg | ORAL_TABLET | ORAL | Status: DC | PRN
  Filled 2021-10-17: qty 1

## 2021-10-17 MED ORDER — AZO CRANBERRY 250-30 MG PO TABS
ORAL_TABLET | Freq: Every day | ORAL | Status: DC | PRN
Start: 2021-10-18 — End: 2021-10-17

## 2021-10-17 MED ORDER — ROCURONIUM BROMIDE 100 MG/10ML IV SOLN
INTRAVENOUS | Status: DC | PRN
  Administered 2021-10-17: 5 mg via INTRAVENOUS
  Administered 2021-10-17: 20 mg via INTRAVENOUS
  Administered 2021-10-17: 25 mg via INTRAVENOUS

## 2021-10-17 MED ORDER — PHENYLEPHRINE 40 MCG/ML (10ML) SYRINGE FOR IV PUSH (FOR BLOOD PRESSURE SUPPORT)
PREFILLED_SYRINGE | INTRAVENOUS | Status: DC | PRN
  Administered 2021-10-17 (×2): 80 ug via INTRAVENOUS

## 2021-10-17 MED ORDER — FLUTICASONE PROPIONATE 50 MCG/ACT NA SUSP
2.0000 | Freq: Two times a day (BID) | NASAL | Status: DC
  Filled 2021-10-17 (×2): qty 16

## 2021-10-17 MED ORDER — SUGAMMADEX SODIUM 200 MG/2ML IV SOLN
INTRAVENOUS | Status: DC | PRN
Start: 2021-10-17 — End: 2021-10-17
  Administered 2021-10-17: 150 mg via INTRAVENOUS

## 2021-10-17 MED ORDER — LIDOCAINE HCL (PF) 2 % IJ SOLN
INTRAMUSCULAR | Status: AC
  Filled 2021-10-17: qty 5

## 2021-10-17 MED ORDER — ROCURONIUM BROMIDE 10 MG/ML (PF) SYRINGE
PREFILLED_SYRINGE | INTRAVENOUS | Status: AC
  Filled 2021-10-17: qty 10

## 2021-10-17 MED ORDER — CEFAZOLIN SODIUM-DEXTROSE 1-4 GM/50ML-% IV SOLN
1.0000 g | Freq: Two times a day (BID) | INTRAVENOUS | Status: DC
  Administered 2021-10-17 – 2021-10-18 (×3): 1 g via INTRAVENOUS
  Filled 2021-10-17 (×4): qty 50

## 2021-10-17 MED ORDER — CEFAZOLIN SODIUM-DEXTROSE 2-3 GM-%(50ML) IV SOLR
INTRAVENOUS | Status: DC | PRN
  Administered 2021-10-17: 2 g via INTRAVENOUS

## 2021-10-17 MED ORDER — EPHEDRINE 5 MG/ML INJ
INTRAVENOUS | Status: AC
  Filled 2021-10-17: qty 5

## 2021-10-17 MED ORDER — LIDOCAINE HCL (CARDIAC) PF 100 MG/5ML IV SOSY
PREFILLED_SYRINGE | INTRAVENOUS | Status: DC | PRN
  Administered 2021-10-17: 60 mg via INTRAVENOUS

## 2021-10-17 MED ORDER — FENTANYL CITRATE (PF) 100 MCG/2ML IJ SOLN: 25.0000 ug | INTRAMUSCULAR | Status: DC | PRN

## 2021-10-17 MED ORDER — OYSTER SHELL CALCIUM/D3 500-5 MG-MCG PO TABS
ORAL_TABLET | Freq: Every day | ORAL | Status: DC
  Administered 2021-10-18 – 2021-10-19 (×2): 1 via ORAL
  Filled 2021-10-17 (×2): qty 1

## 2021-10-17 MED ORDER — PROPOFOL 10 MG/ML IV BOLUS
INTRAVENOUS | Status: AC
  Filled 2021-10-17: qty 20

## 2021-10-17 MED ORDER — ONDANSETRON HCL 4 MG PO TABS
4.0000 mg | ORAL_TABLET | Freq: Four times a day (QID) | ORAL | Status: DC | PRN
Start: 2021-10-17 — End: 2021-10-19

## 2021-10-17 MED ORDER — METOPROLOL SUCCINATE ER 25 MG PO TB24
25.0000 mg | ORAL_TABLET | Freq: Every day | ORAL | Status: DC
  Administered 2021-10-17 – 2021-10-18 (×2): 25 mg via ORAL
  Filled 2021-10-17 (×2): qty 1

## 2021-10-17 MED ORDER — PANTOPRAZOLE SODIUM 40 MG PO TBEC
40.0000 mg | DELAYED_RELEASE_TABLET | Freq: Every day | ORAL | Status: DC
Start: 2021-10-17 — End: 2021-10-19
  Administered 2021-10-18 – 2021-10-19 (×2): 40 mg via ORAL
  Filled 2021-10-17 (×2): qty 1

## 2021-10-17 MED ORDER — SUCCINYLCHOLINE CHLORIDE 200 MG/10ML IV SOSY
PREFILLED_SYRINGE | INTRAVENOUS | Status: AC
  Filled 2021-10-17: qty 10

## 2021-10-17 MED ORDER — LIDOCAINE-EPINEPHRINE 1 %-1:100000 IJ SOLN
INTRAMUSCULAR | Status: DC | PRN
  Administered 2021-10-17: 4 mL

## 2021-10-17 MED ORDER — ACETAMINOPHEN 650 MG RE SUPP: 650.0000 mg | Freq: Four times a day (QID) | RECTAL | Status: DC | PRN

## 2021-10-17 MED ORDER — PHENYLEPHRINE HCL (PRESSORS) 10 MG/ML IV SOLN
INTRAVENOUS | Status: AC
  Filled 2021-10-17: qty 1

## 2021-10-17 MED ORDER — LACTATED RINGERS IV SOLN: INTRAVENOUS | Status: DC | PRN

## 2021-10-17 MED ORDER — ONDANSETRON HCL 4 MG/2ML IJ SOLN
INTRAMUSCULAR | Status: AC
  Filled 2021-10-17: qty 2

## 2021-10-17 MED ORDER — CEFAZOLIN SODIUM 1 G IJ SOLR
INTRAMUSCULAR | Status: AC
  Filled 2021-10-17: qty 20

## 2021-10-17 MED ORDER — SUCCINYLCHOLINE CHLORIDE 200 MG/10ML IV SOSY
PREFILLED_SYRINGE | INTRAVENOUS | Status: DC | PRN
  Administered 2021-10-17: 100 mg via INTRAVENOUS

## 2021-10-17 MED ORDER — DEXAMETHASONE SODIUM PHOSPHATE 10 MG/ML IJ SOLN
INTRAMUSCULAR | Status: DC | PRN
  Administered 2021-10-17: 10 mg via INTRAVENOUS

## 2021-10-17 MED ORDER — OXYBUTYNIN CHLORIDE 5 MG PO TABS
5.0000 mg | ORAL_TABLET | Freq: Three times a day (TID) | ORAL | Status: DC
Start: 2021-10-17 — End: 2021-10-18
  Administered 2021-10-17 – 2021-10-18 (×3): 5 mg via ORAL
  Filled 2021-10-17 (×6): qty 1

## 2021-10-17 MED ORDER — ACETAMINOPHEN 500 MG PO TABS
1000.0000 mg | ORAL_TABLET | Freq: Three times a day (TID) | ORAL | Status: DC
  Administered 2021-10-17 – 2021-10-18 (×5): 1000 mg via ORAL
  Filled 2021-10-17 (×6): qty 2

## 2021-10-17 MED ORDER — DEXAMETHASONE SODIUM PHOSPHATE 10 MG/ML IJ SOLN
INTRAMUSCULAR | Status: AC
  Filled 2021-10-17: qty 1

## 2021-10-17 MED ORDER — CHLORHEXIDINE GLUCONATE CLOTH 2 % EX PADS
6.0000 | MEDICATED_PAD | Freq: Every day | CUTANEOUS | Status: DC
  Administered 2021-10-17 – 2021-10-19 (×2): 6 via TOPICAL

## 2021-10-17 MED ORDER — FENTANYL CITRATE (PF) 100 MCG/2ML IJ SOLN
INTRAMUSCULAR | Status: DC | PRN
Start: 2021-10-17 — End: 2021-10-17
  Administered 2021-10-17 (×2): 50 ug via INTRAVENOUS

## 2021-10-17 MED ORDER — ACETAMINOPHEN 325 MG PO TABS
650.0000 mg | ORAL_TABLET | Freq: Four times a day (QID) | ORAL | Status: DC | PRN
  Administered 2021-10-19: 650 mg via ORAL
  Filled 2021-10-17: qty 2

## 2021-10-17 MED ORDER — LORATADINE 10 MG PO TABS
10.0000 mg | ORAL_TABLET | Freq: Every day | ORAL | Status: DC
  Administered 2021-10-18 – 2021-10-19 (×2): 10 mg via ORAL
  Filled 2021-10-17 (×2): qty 1

## 2021-10-17 MED ORDER — FENTANYL CITRATE (PF) 100 MCG/2ML IJ SOLN
INTRAMUSCULAR | Status: AC
  Filled 2021-10-17: qty 2

## 2021-10-17 MED ORDER — DEXMEDETOMIDINE (PRECEDEX) IN NS 20 MCG/5ML (4 MCG/ML) IV SYRINGE
PREFILLED_SYRINGE | INTRAVENOUS | Status: DC | PRN
  Administered 2021-10-17: 8 ug via INTRAVENOUS

## 2021-10-17 MED ORDER — ONDANSETRON HCL 4 MG/2ML IJ SOLN: 4.0000 mg | Freq: Once | INTRAMUSCULAR | Status: DC | PRN

## 2021-10-17 MED ORDER — SURGIFLO WITH THROMBIN (HEMOSTATIC MATRIX KIT) OPTIME
TOPICAL | Status: DC | PRN
  Administered 2021-10-17: 1 via TOPICAL

## 2021-10-17 SURGICAL SUPPLY — 69 items
APPLICATOR CHLORAPREP 10.5 ORG (MISCELLANEOUS) ×3 IMPLANT
BENZOIN TINCTURE PRP APPL 2/3 (GAUZE/BANDAGES/DRESSINGS) ×3 IMPLANT
BLADE CLIPPER SPEC (BLADE) ×3 IMPLANT
BUR ACORN 7.5 PRECISION (BURR) ×1 IMPLANT
BUR ACORN 7.5MM PRECISION (BURR)
BUR MATCHSTICK NEURO 3.0 LAGG (BURR) ×3 IMPLANT
CATH ROBINSON RED A/P 10FR (CATHETERS) IMPLANT
COUNTER NEEDLE 20/40 LG (NEEDLE) ×3 IMPLANT
DRAPE INCISE IOBAN 66X45 STRL (DRAPES) ×3 IMPLANT
DRAPE POUCH INSTRU U-SHP 10X18 (DRAPES) ×6 IMPLANT
DRAPE SURG 17X11 SM STRL (DRAPES) ×12 IMPLANT
DRSG TEGADERM 4X4.75 (GAUZE/BANDAGES/DRESSINGS) ×3 IMPLANT
DRSG TELFA 4X3 1S NADH ST (GAUZE/BANDAGES/DRESSINGS) ×3 IMPLANT
ELECT CAUTERY BLADE TIP 2.5 (TIP) ×3
ELECT REM PT RETURN 9FT ADLT (ELECTROSURGICAL) ×3
ELECTRODE CAUTERY BLDE TIP 2.5 (TIP) ×1 IMPLANT
ELECTRODE REM PT RTRN 9FT ADLT (ELECTROSURGICAL) ×1 IMPLANT
GAUZE 4X4 16PLY ~~LOC~~+RFID DBL (SPONGE) ×9 IMPLANT
GAUZE SPONGE 4X4 12PLY STRL (GAUZE/BANDAGES/DRESSINGS) ×12 IMPLANT
GAUZE XEROFORM 1X8 LF (GAUZE/BANDAGES/DRESSINGS) IMPLANT
GAUZE XEROFORM 5X9 LF (GAUZE/BANDAGES/DRESSINGS) ×3 IMPLANT
GLOVE SRG 8 PF TXTR STRL LF DI (GLOVE) ×1 IMPLANT
GLOVE SURG SYN 6.5 ES PF (GLOVE) ×6 IMPLANT
GLOVE SURG SYN 6.5 PF PI (GLOVE) ×2 IMPLANT
GLOVE SURG SYN 8.0 (GLOVE) ×6 IMPLANT
GLOVE SURG SYN 8.0 PF PI (GLOVE) ×2 IMPLANT
GLOVE SURG UNDER POLY LF SZ6.5 (GLOVE) ×6 IMPLANT
GLOVE SURG UNDER POLY LF SZ8 (GLOVE) ×2
GOWN SRG LRG LVL 4 IMPRV REINF (GOWNS) ×2 IMPLANT
GOWN STRL REIN LRG LVL4 (GOWNS) ×4
GOWN STRL REUS W/ TWL XL LVL3 (GOWN DISPOSABLE) ×2 IMPLANT
GOWN STRL REUS W/TWL XL LVL3 (GOWN DISPOSABLE) ×4
GRADUATE 1200CC STRL 31836 (MISCELLANEOUS) ×3 IMPLANT
HOLDER FOLEY CATH W/STRAP (MISCELLANEOUS) IMPLANT
KIT DRAIN CSF ACCUDRAIN (MISCELLANEOUS) ×1 IMPLANT
KIT TURNOVER KIT A (KITS) ×3 IMPLANT
MANIFOLD NEPTUNE II (INSTRUMENTS) ×3 IMPLANT
MARKER SKIN DUAL TIP RULER LAB (MISCELLANEOUS) ×6 IMPLANT
NDL HYPO 25GX1X1/2 BEV (NEEDLE) ×1 IMPLANT
NEEDLE HYPO 25GX1X1/2 BEV (NEEDLE) ×3 IMPLANT
NS IRRIG 1000ML POUR BTL (IV SOLUTION) ×12 IMPLANT
PACK CRANIOTOMY CUSTOM (CUSTOM PROCEDURE TRAY) ×3 IMPLANT
PAD ARMBOARD 7.5X6 YLW CONV (MISCELLANEOUS) ×6 IMPLANT
PAD PREP 24X41 OB/GYN DISP (PERSONAL CARE ITEMS) ×3 IMPLANT
PERFORATOR LRG  14-11MM (BIT) ×2
PERFORATOR LRG 14-11MM (BIT) IMPLANT
PIN MAYFIELD SKULL DISP (PIN) ×1 IMPLANT
PLATE 1.5/0.5 18.5MM BURR HOLE (Plate) ×4 IMPLANT
SCREW SELF DRILL HT 1.5/4MM (Screw) ×10 IMPLANT
SCRUB TECHNI CARE 4 OZ NO DYE (MISCELLANEOUS) ×3 IMPLANT
SET CATH VENT DRAIN 3-15 1.9D (DRAIN) ×4 IMPLANT
SHEET NEURO XL SOL CTL (MISCELLANEOUS) ×3 IMPLANT
SOL PREP PVP 2OZ (MISCELLANEOUS) ×3
SOLUTION PREP PVP 2OZ (MISCELLANEOUS) ×1 IMPLANT
STAPLER SKIN PROX 35W (STAPLE) ×6 IMPLANT
SURGIFLO W/THROMBIN 8M KIT (HEMOSTASIS) ×3 IMPLANT
SURGILUBE 2OZ TUBE FLIPTOP (MISCELLANEOUS) ×3 IMPLANT
SUT ETHILON 3 0 FSL (SUTURE) ×9 IMPLANT
SUT POLYSORB 3-0 18 V-20 (SUTURE) ×6 IMPLANT
SUT SILK 0 FSL (SUTURE) ×6 IMPLANT
SYR 10ML LL (SYRINGE) ×3 IMPLANT
SYSTEM LIMITORR VOL LMT 20ML (CATHETERS) ×4 IMPLANT
TAPE CLOTH 3X10 WHT NS LF (GAUZE/BANDAGES/DRESSINGS) IMPLANT
TAPE HY-TAPE .5X5Y PINK LF (GAUZE/BANDAGES/DRESSINGS) IMPLANT
TOWEL OR 17X26 4PK STRL BLUE (TOWEL DISPOSABLE) ×9 IMPLANT
TRAY FOLEY MTR SLVR 16FR STAT (SET/KITS/TRAYS/PACK) IMPLANT
TUBING ART PRESS 48 MALE/FEM (TUBING) IMPLANT
WATER STERILE IRR 1000ML POUR (IV SOLUTION) ×3 IMPLANT
WATER STERILE IRR 500ML POUR (IV SOLUTION) ×3 IMPLANT

## 2021-10-17 NOTE — Assessment & Plan Note (Addendum)
Renal function is stable.

## 2021-10-17 NOTE — Interval H&P Note (Signed)
I reviewed the case with Ms. Mcgeachy and her friend again.  They understand the risk and ready to proceed ? ?History and Physical Interval Note: ? ?10/17/2021 ?11:17 AM ? ?Aimee Oliver  has presented today for surgery, with the diagnosis of Subdural Hematoma.  The various methods of treatment have been discussed with the patient and family. After consideration of risks, benefits and other options for treatment, the patient has consented to  Procedure(s): ?BURR HOLES (Bilateral) as a surgical intervention.  The patient's history has been reviewed, patient examined, no change in status, stable for surgery.  I have reviewed the patient's chart and labs.  Questions were answered to the patient's satisfaction.   ? ? ?Aimee Oliver ? ? ?

## 2021-10-17 NOTE — Op Note (Signed)
Operative Note ?  ?SURGERY DATE:  10/17/2021 ?  ?PRE-OP DIAGNOSIS:  Chronic Subdural hematoma ?  ?POST-OP DIAGNOSIS: Post-Op Diagnosis Codes: ? Chronic Subdural Hematoma ?  ?Procedure(s) with comments: ?Bilateral burr hole craniectomy with subdural evacuation ?  ?SURGEON:  ?   Malen Gauze, MD  ? ?  ?ANESTHESIA: General  ?  ?OPERATIVE FINDINGS: Chronic Subdural hematoma ? ?PROCEDURE ? Indication ?Aimee Oliver was brought to the ED for evaluation confusion and dizziness. She was found on imaging to have bilateral chronic subdural hematoma with mass effect.  We discussed drainage to help with these symptoms. The risks of surgery including hematoma, infection, seizure, stroke, damage to cortex, and re-accumulation were discussed and she wished to proceed with surgery.  ? ?Procedure ?After obtaining informed consent, the patient was taken to the Operating Room where general anesthesia was initiated. Vascular access was obtained. The patient was positioned supine. A 5 cm incision was planned using imaging overlying the area of maximum hematoma in the left frontal and right frontal area. A time out was performed. Local anesthesia was instilled along the planned incision site. ? ?The scalp was opened sharply to the level of the skull and a retractor placed. A burr hole craniectomy was performed using the perforator drill bit.  Hemostasis was achieved. This was performed bilaterally. The dura was coagulated and opened bilaterally in a cruciate manner. There was an immediate egress of dark fluid consistent with chronic hematoma. Two liters of saline were used to irrigate the subdural space removing all chronic blood. Once the fluid remained clear, a drain was placed in the subdural space and passed posteriorly through the galea to exit the skin. This was placed posteriorly on the right and anteriorly on the left.  Hemostasis was achieved.  ? ?The wound was irrigated with saline.  A burr hole cover was placed with the Lorenz  plating system bilaterally. The galea was closed with 2-0 interrupted Vicryl sutures. The skin above was closed with staples. The drain was secured with a 3-0 Nylon and attached to a drainage system.  ?  ?The patient tolerated the procedure well and was seen immediately post-operatively to be following commands with symmetric movement.  The patient was taken to the ICU where recovery contnued. Following the procedure, I spoke with the patient?s family about the procedure and answered all questions.  ? ?ESTIMATED BLOOD LOSS:  ?100 cc ?  ?SPECIMENS ?None ?  ?IMPLANT ?None ?  ?  ?I performed the case in its entirety  ?  ?Deetta Perla, MD ?[phone] ?  ? ? ? ?

## 2021-10-17 NOTE — Assessment & Plan Note (Addendum)
Trace amount of subarachnoid hemorrhage.  Followed by neurosurgery.

## 2021-10-17 NOTE — Assessment & Plan Note (Addendum)
Surgery performed yesterday, patient still has drains.  Mental status has improved today.  We will transfer to medical floor once drain is out by neurosurgery.

## 2021-10-17 NOTE — H&P (View-Only) (Signed)
Neurosurgery-New Consultation Evaluation 10/17/2021 Aimee Oliver [id number]  Identifying Statement: Aimee Oliver is a 81 y.o. female from [place] [id number] with confusion  Physician Requesting Consultation: [facility]  History of Present Illness: Aimee Oliver is here with worsening confusion per friend. She notes a fall a few days ago but denies previous ones. She is not having any weakness but is having balance and dizziness issues. She does have some chronic issues with this with history of acoustic schwannoma. As part of workup for that, she had a CT of the head and this showed large bilateral chronic SDH with a small acute portion. We are consulted for evaluation.   Past Medical History:  Past Medical History:  Diagnosis Date   Acoustic neuroma (HCC)    Anemia    boarderline anemic   Arthritis    Headache    sinus head aches   Hypercholesterolemia    Hypertension    PONV (postoperative nausea and vomiting)    with hysterectomy    Social History: Social History   Socioeconomic History   Marital status: Divorced    Spouse name: Not on file   Number of children: Not on file   Years of education: Not on file   Highest education level: Not on file  Occupational History   Not on file  Tobacco Use   Smoking status: Never   Smokeless tobacco: Never  Vaping Use   Vaping Use: Never used  Substance and Sexual Activity   Alcohol use: Yes    Comment: 1 cocktail every night   Drug use: No   Sexual activity: Not on file  Other Topics Concern   Not on file  Social History Narrative   Not on file   Social Determinants of Health   Financial Resource Strain: Not on file  Food Insecurity: Not on file  Transportation Needs: Not on file  Physical Activity: Not on file  Stress: Not on file  Social Connections: Not on file  Intimate Partner Violence: Not on file    Family History: Family History  Problem Relation Age of Onset   Stomach cancer Mother    Leukemia  Father     Review of Systems:  Review of Systems - General ROS: Negative Psychological ROS: Negative Ophthalmic ROS: Negative ENT ROS: Negative Hematological and Lymphatic ROS: Negative  Endocrine ROS: Negative Respiratory ROS: Negative Cardiovascular ROS: Negative Gastrointestinal ROS: Negative Genito-Urinary ROS: Negative Musculoskeletal ROS: Negative Neurological ROS: Positive for dizziness, confusion Dermatological ROS: Negative  Physical Exam: BP 138/86    Pulse 92    Temp 98.3 F (36.8 C) (Oral)    Resp 20    Ht 5' 3.5" (1.613 m)    Wt 64.9 kg    SpO2 96%    BMI 24.95 kg/m  Body mass index is 24.95 kg/m. Body surface area is 1.71 meters squared. General appearance: Alert, cooperative, in no acute distress Head: Normocephalic, some bruising at occiput and around left eye Eyes: Normal, EOM intact Ext: No edema in LE bilaterally  Neurologic exam:  Mental status: alertness: alert, orientation: person affect: normal Speech: fluent and clear, names and repeats Cranial nerves:  II: Visual fields are full by confrontation, no ptosis V/VII:no evidence of facial droop or weakness VIII: hard of hearing XI: trapezius strength symmetric,  sternocleidomastoid strength symmetric XII: tongue strength symmetric  Motor:strength symmetric 5/5, normal muscle mass and tone in all extremities Sensory: intact to light touch in all extremities Gait: not tested  Laboratory:  Results for orders placed or performed during the hospital encounter of 10/16/21  Resp Panel by RT-PCR (Flu A&B, Covid) Nasopharyngeal Swab   Specimen: Nasopharyngeal Swab; Nasopharyngeal(NP) swabs in vial transport medium  Result Value Ref Range   SARS Coronavirus 2 by RT PCR NEGATIVE NEGATIVE   Influenza A by PCR NEGATIVE NEGATIVE   Influenza B by PCR NEGATIVE NEGATIVE  CBC with Differential  Result Value Ref Range   WBC 8.5 4.0 - 10.5 K/uL   RBC 2.47 (L) 3.87 - 5.11 MIL/uL   Hemoglobin 8.3 (L) 12.0 - 15.0  g/dL   HCT 26.1 (L) 36.0 - 46.0 %   MCV 105.7 (H) 80.0 - 100.0 fL   MCH 33.6 26.0 - 34.0 pg   MCHC 31.8 30.0 - 36.0 g/dL   RDW 13.9 11.5 - 15.5 %   Platelets 258 150 - 400 K/uL   nRBC 0.0 0.0 - 0.2 %   Neutrophils Relative % 75 %   Neutro Abs 6.3 1.7 - 7.7 K/uL   Lymphocytes Relative 11 %   Lymphs Abs 0.9 0.7 - 4.0 K/uL   Monocytes Relative 12 %   Monocytes Absolute 1.1 (H) 0.1 - 1.0 K/uL   Eosinophils Relative 1 %   Eosinophils Absolute 0.1 0.0 - 0.5 K/uL   Basophils Relative 0 %   Basophils Absolute 0.0 0.0 - 0.1 K/uL   Immature Granulocytes 1 %   Abs Immature Granulocytes 0.10 (H) 0.00 - 0.07 K/uL  Comprehensive metabolic panel  Result Value Ref Range   Sodium 132 (L) 135 - 145 mmol/L   Potassium 3.5 3.5 - 5.1 mmol/L   Chloride 102 98 - 111 mmol/L   CO2 20 (L) 22 - 32 mmol/L   Glucose, Bld 123 (H) 70 - 99 mg/dL   BUN 33 (H) 8 - 23 mg/dL   Creatinine, Ser 1.56 (H) 0.44 - 1.00 mg/dL   Calcium 9.5 8.9 - 10.3 mg/dL   Total Protein 7.4 6.5 - 8.1 g/dL   Albumin 4.1 3.5 - 5.0 g/dL   AST 47 (H) 15 - 41 U/L   ALT 31 0 - 44 U/L   Alkaline Phosphatase 58 38 - 126 U/L   Total Bilirubin 1.0 0.3 - 1.2 mg/dL   GFR, Estimated 33 (L) >60 mL/min   Anion gap 10 5 - 15  TSH  Result Value Ref Range   TSH 4.061 0.350 - 4.500 uIU/mL  Urinalysis, Complete w Microscopic Nasopharyngeal Swab  Result Value Ref Range   Color, Urine YELLOW (A) YELLOW   APPearance HAZY (A) CLEAR   Specific Gravity, Urine 1.013 1.005 - 1.030   pH 5.0 5.0 - 8.0   Glucose, UA NEGATIVE NEGATIVE mg/dL   Hgb urine dipstick MODERATE (A) NEGATIVE   Bilirubin Urine NEGATIVE NEGATIVE   Ketones, ur 5 (A) NEGATIVE mg/dL   Protein, ur 100 (A) NEGATIVE mg/dL   Nitrite NEGATIVE NEGATIVE   Leukocytes,Ua TRACE (A) NEGATIVE   RBC / HPF 0-5 0 - 5 RBC/hpf   WBC, UA 0-5 0 - 5 WBC/hpf   Bacteria, UA FEW (A) NONE SEEN   Squamous Epithelial / LPF 0-5 0 - 5   Mucus PRESENT   Protime-INR  Result Value Ref Range   Prothrombin  Time 12.1 11.4 - 15.2 seconds   INR 0.9 0.8 - 1.2  APTT  Result Value Ref Range   aPTT 23 (L) 24 - 36 seconds   I personally reviewed labs  Imaging: Results for orders placed during the  hospital encounter of 12/30/15  MR Brain W Wo Contrast  Narrative CLINICAL DATA:  Left-sided hearing loss. Acoustic neuroma follow-up.  EXAM: MRI HEAD WITHOUT AND WITH CONTRAST  TECHNIQUE: Multiplanar, multiecho pulse sequences of the brain and surrounding structures were obtained without and with intravenous contrast.  CONTRAST:  42m MULTIHANCE GADOBENATE DIMEGLUMINE 529 MG/ML IV SOLN  COMPARISON:  10/01/2014  FINDINGS: There is no evidence of acute infarct, intracranial hemorrhage, intra-axial mass, midline shift, or extra-axial fluid collection. Mild cerebral atrophy is unchanged. Small foci of T2 hyperintensity scattered throughout the cerebral white matter are unchanged and nonspecific but compatible with mild chronic small vessel ischemic disease. No abnormal brain parenchymal enhancement is seen.  Prior left cataract extraction is noted. No significant inflammatory disease is seen in the paranasal sinuses or mastoid air cells. Major intracranial vascular flow voids are preserved.  Dedicated imaging through the internal auditory canals demonstrates a normal appearance of the right seventh and eighth cranial nerve complex. Enhancing mass in the left cerebellopontine angle extending to the mid point of the internal auditory canal measures 1.9 x 1.6 x 1.6 cm (previously 2.1 x 1.7 x 1.8 cm). There is no significant mass effect on the brainstem or cerebellum. The labyrinthine structures demonstrate normal signal bilaterally.  IMPRESSION: 1. Stable to minimally decreased size of left-sided vestibular schwannoma. 2. Mild chronic small vessel ischemic disease, unchanged.   Electronically Signed By: ALogan BoresM.D. On: 12/30/2015 11:19  I personally reviewed radiology studies  to include:   Impression/Plan:  Aimee FMcrayis here with bilateral chronic SDH. We discussed that given the size, burr hole drainage is reasonable although I cant guarantee all her confusion will resolve. She does not appear to have a UTI and we did go over the surgery in detail. There is small risk of infection, reaccumulation, bleeding, and anesthesia. Given her age, I do think she needs medical clearance and we will ask hospitalist for assistance. Her labs are unremarkable except mild hyponatremia which appears chronic. We discussed possibly doing this later today and she is in agreement.    1.  Diagnosis: Chronic bilateral SDH  2.  Plan - Monitor overnight, if medically able, will proceed to OR for drainage

## 2021-10-17 NOTE — Assessment & Plan Note (Signed)
Continue ppi

## 2021-10-17 NOTE — Progress Notes (Signed)
Patient alert and oriented x4,on room air, NSR. Patient is following commands. Neuro assessment continues to remain at baseline. No signs of additional bleeding on dressings. Drain bilaterally draining, Left drain has total output of 100, right drain total output at 75. MD notified of output. Patient resting watching television. Hearing aids are in patient belonging bag, but does not have charger. ?

## 2021-10-17 NOTE — Assessment & Plan Note (Signed)
Chronic. 

## 2021-10-17 NOTE — Progress Notes (Signed)
Given report from previous nurse. Informed patient and alert and oriented x4  but forgetful when patient was admitted. Patient alert but oriented to self and place at times. Per night nurse this was a change. Notified MD. Received new orders ?

## 2021-10-17 NOTE — Subjective & Objective (Signed)
CC: headache, visual hallucinations ?HPI: ?81 year old female with a history of hypertension, CKD stage IV, history of acoustic schwannoma with chronic vertigo presents to the ER today with visual hallucinations for the last 3 to 4 days, altered mental status.  Patient fell out of bed around October 04, 2021.  She was seen in the primary care office on October 05, 2021 for routine care.  No imaging was performed. ? ?Over the last week, the patient's significant other Bunnie Pion has noted the patient being increasingly altered.  She has had visual hallucinations for the last 3 days.  She has been seeing her grandchildren in her home.  She is also complaining of a severe headache.  No nausea, vomiting.  No fever, chills. ? ?Patient brought to the ER today due to hallucinations. ? ?Labs showed stable creatinine of 1.56, sodium 132, BUN of 33 ? ?CBC showed white count of 8.5, hemoglobin of 8.3, platelets of 258 hemoglobin stable.  Hemoglobin back in December 2022 was 8.1. ? ?Of significance is the patient CT head which showed bilateral subdural hemorrhages measuring up to 1.4 cm bilaterally.  There is also some heterogenicity in the blood products on the left side suggesting acute component.  There is also trace left subarachnoid hemorrhage.  There is a 2 mm left to right midline shift.  There is partial effacement of the cerebral sulci. ? ?EDP is consulted Dr. Lacinda Axon who personally evaluated the patient in the ED.  He and I both have agreed that the patient will be admitted by the hospitalist service.  Patient and significant other want to proceed with surgery for subdural hematoma drainage.  Dr. Lacinda Axon has agreed to assume care as primary attending after surgery. ? ?[facility] hospitalist to admit the patient. ?

## 2021-10-17 NOTE — Consult Note (Signed)
Neurosurgery-New Consultation Evaluation 10/17/2021 KATJA BLUE [id number]  Identifying Statement: Aimee Oliver is a 81 y.o. female from [place] [id number] with confusion  Physician Requesting Consultation: [facility]  History of Present Illness: Aimee Oliver is here with worsening confusion per friend. She notes a fall a few days ago but denies previous ones. She is not having any weakness but is having balance and dizziness issues. She does have some chronic issues with this with history of acoustic schwannoma. As part of workup for that, she had a CT of the head and this showed large bilateral chronic SDH with a small acute portion. We are consulted for evaluation.   Past Medical History:  Past Medical History:  Diagnosis Date   Acoustic neuroma (HCC)    Anemia    boarderline anemic   Arthritis    Headache    sinus head aches   Hypercholesterolemia    Hypertension    PONV (postoperative nausea and vomiting)    with hysterectomy    Social History: Social History   Socioeconomic History   Marital status: Divorced    Spouse name: Not on file   Number of children: Not on file   Years of education: Not on file   Highest education level: Not on file  Occupational History   Not on file  Tobacco Use   Smoking status: Never   Smokeless tobacco: Never  Vaping Use   Vaping Use: Never used  Substance and Sexual Activity   Alcohol use: Yes    Comment: 1 cocktail every night   Drug use: No   Sexual activity: Not on file  Other Topics Concern   Not on file  Social History Narrative   Not on file   Social Determinants of Health   Financial Resource Strain: Not on file  Food Insecurity: Not on file  Transportation Needs: Not on file  Physical Activity: Not on file  Stress: Not on file  Social Connections: Not on file  Intimate Partner Violence: Not on file    Family History: Family History  Problem Relation Age of Onset   Stomach cancer Mother    Leukemia  Father     Review of Systems:  Review of Systems - General ROS: Negative Psychological ROS: Negative Ophthalmic ROS: Negative ENT ROS: Negative Hematological and Lymphatic ROS: Negative  Endocrine ROS: Negative Respiratory ROS: Negative Cardiovascular ROS: Negative Gastrointestinal ROS: Negative Genito-Urinary ROS: Negative Musculoskeletal ROS: Negative Neurological ROS: Positive for dizziness, confusion Dermatological ROS: Negative  Physical Exam: BP 138/86    Pulse 92    Temp 98.3 F (36.8 C) (Oral)    Resp 20    Ht 5' 3.5" (1.613 m)    Wt 64.9 kg    SpO2 96%    BMI 24.95 kg/m  Body mass index is 24.95 kg/m. Body surface area is 1.71 meters squared. General appearance: Alert, cooperative, in no acute distress Head: Normocephalic, some bruising at occiput and around left eye Eyes: Normal, EOM intact Ext: No edema in LE bilaterally  Neurologic exam:  Mental status: alertness: alert, orientation: person affect: normal Speech: fluent and clear, names and repeats Cranial nerves:  II: Visual fields are full by confrontation, no ptosis V/VII:no evidence of facial droop or weakness VIII: hard of hearing XI: trapezius strength symmetric,  sternocleidomastoid strength symmetric XII: tongue strength symmetric  Motor:strength symmetric 5/5, normal muscle mass and tone in all extremities Sensory: intact to light touch in all extremities Gait: not tested  Laboratory:  Results for orders placed or performed during the hospital encounter of 10/16/21  Resp Panel by RT-PCR (Flu A&B, Covid) Nasopharyngeal Swab   Specimen: Nasopharyngeal Swab; Nasopharyngeal(NP) swabs in vial transport medium  Result Value Ref Range   SARS Coronavirus 2 by RT PCR NEGATIVE NEGATIVE   Influenza A by PCR NEGATIVE NEGATIVE   Influenza B by PCR NEGATIVE NEGATIVE  CBC with Differential  Result Value Ref Range   WBC 8.5 4.0 - 10.5 K/uL   RBC 2.47 (L) 3.87 - 5.11 MIL/uL   Hemoglobin 8.3 (L) 12.0 - 15.0  g/dL   HCT 26.1 (L) 36.0 - 46.0 %   MCV 105.7 (H) 80.0 - 100.0 fL   MCH 33.6 26.0 - 34.0 pg   MCHC 31.8 30.0 - 36.0 g/dL   RDW 13.9 11.5 - 15.5 %   Platelets 258 150 - 400 K/uL   nRBC 0.0 0.0 - 0.2 %   Neutrophils Relative % 75 %   Neutro Abs 6.3 1.7 - 7.7 K/uL   Lymphocytes Relative 11 %   Lymphs Abs 0.9 0.7 - 4.0 K/uL   Monocytes Relative 12 %   Monocytes Absolute 1.1 (H) 0.1 - 1.0 K/uL   Eosinophils Relative 1 %   Eosinophils Absolute 0.1 0.0 - 0.5 K/uL   Basophils Relative 0 %   Basophils Absolute 0.0 0.0 - 0.1 K/uL   Immature Granulocytes 1 %   Abs Immature Granulocytes 0.10 (H) 0.00 - 0.07 K/uL  Comprehensive metabolic panel  Result Value Ref Range   Sodium 132 (L) 135 - 145 mmol/L   Potassium 3.5 3.5 - 5.1 mmol/L   Chloride 102 98 - 111 mmol/L   CO2 20 (L) 22 - 32 mmol/L   Glucose, Bld 123 (H) 70 - 99 mg/dL   BUN 33 (H) 8 - 23 mg/dL   Creatinine, Ser 1.56 (H) 0.44 - 1.00 mg/dL   Calcium 9.5 8.9 - 10.3 mg/dL   Total Protein 7.4 6.5 - 8.1 g/dL   Albumin 4.1 3.5 - 5.0 g/dL   AST 47 (H) 15 - 41 U/L   ALT 31 0 - 44 U/L   Alkaline Phosphatase 58 38 - 126 U/L   Total Bilirubin 1.0 0.3 - 1.2 mg/dL   GFR, Estimated 33 (L) >60 mL/min   Anion gap 10 5 - 15  TSH  Result Value Ref Range   TSH 4.061 0.350 - 4.500 uIU/mL  Urinalysis, Complete w Microscopic Nasopharyngeal Swab  Result Value Ref Range   Color, Urine YELLOW (A) YELLOW   APPearance HAZY (A) CLEAR   Specific Gravity, Urine 1.013 1.005 - 1.030   pH 5.0 5.0 - 8.0   Glucose, UA NEGATIVE NEGATIVE mg/dL   Hgb urine dipstick MODERATE (A) NEGATIVE   Bilirubin Urine NEGATIVE NEGATIVE   Ketones, ur 5 (A) NEGATIVE mg/dL   Protein, ur 100 (A) NEGATIVE mg/dL   Nitrite NEGATIVE NEGATIVE   Leukocytes,Ua TRACE (A) NEGATIVE   RBC / HPF 0-5 0 - 5 RBC/hpf   WBC, UA 0-5 0 - 5 WBC/hpf   Bacteria, UA FEW (A) NONE SEEN   Squamous Epithelial / LPF 0-5 0 - 5   Mucus PRESENT   Protime-INR  Result Value Ref Range   Prothrombin  Time 12.1 11.4 - 15.2 seconds   INR 0.9 0.8 - 1.2  APTT  Result Value Ref Range   aPTT 23 (L) 24 - 36 seconds   I personally reviewed labs  Imaging: Results for orders placed during the  hospital encounter of 12/30/15  MR Brain W Wo Contrast  Narrative CLINICAL DATA:  Left-sided hearing loss. Acoustic neuroma follow-up.  EXAM: MRI HEAD WITHOUT AND WITH CONTRAST  TECHNIQUE: Multiplanar, multiecho pulse sequences of the brain and surrounding structures were obtained without and with intravenous contrast.  CONTRAST:  3m MULTIHANCE GADOBENATE DIMEGLUMINE 529 MG/ML IV SOLN  COMPARISON:  10/01/2014  FINDINGS: There is no evidence of acute infarct, intracranial hemorrhage, intra-axial mass, midline shift, or extra-axial fluid collection. Mild cerebral atrophy is unchanged. Small foci of T2 hyperintensity scattered throughout the cerebral white matter are unchanged and nonspecific but compatible with mild chronic small vessel ischemic disease. No abnormal brain parenchymal enhancement is seen.  Prior left cataract extraction is noted. No significant inflammatory disease is seen in the paranasal sinuses or mastoid air cells. Major intracranial vascular flow voids are preserved.  Dedicated imaging through the internal auditory canals demonstrates a normal appearance of the right seventh and eighth cranial nerve complex. Enhancing mass in the left cerebellopontine angle extending to the mid point of the internal auditory canal measures 1.9 x 1.6 x 1.6 cm (previously 2.1 x 1.7 x 1.8 cm). There is no significant mass effect on the brainstem or cerebellum. The labyrinthine structures demonstrate normal signal bilaterally.  IMPRESSION: 1. Stable to minimally decreased size of left-sided vestibular schwannoma. 2. Mild chronic small vessel ischemic disease, unchanged.   Electronically Signed By: ALogan BoresM.D. On: 12/30/2015 11:19  I personally reviewed radiology studies  to include:   Impression/Plan:  Aimee FKleckais here with bilateral chronic SDH. We discussed that given the size, burr hole drainage is reasonable although I cant guarantee all her confusion will resolve. She does not appear to have a UTI and we did go over the surgery in detail. There is small risk of infection, reaccumulation, bleeding, and anesthesia. Given her age, I do think she needs medical clearance and we will ask hospitalist for assistance. Her labs are unremarkable except mild hyponatremia which appears chronic. We discussed possibly doing this later today and she is in agreement.    1.  Diagnosis: Chronic bilateral SDH  2.  Plan - Monitor overnight, if medically able, will proceed to OR for drainage

## 2021-10-17 NOTE — Plan of Care (Signed)

## 2021-10-17 NOTE — Assessment & Plan Note (Addendum)
Continue current treatment. 

## 2021-10-17 NOTE — Progress Notes (Signed)
?  Progress Note ? ? ?Patient: Aimee Oliver CLE:[id number] DOB: [birth year] DOA: 10/16/2021     1 ?DOS: the patient was seen and examined on 10/17/2021 ?  ?Brief hospital course: ?81 year old female with a history of hypertension, CKD stage IV, history of acoustic schwannoma with chronic vertigo presents to the ER today with visual hallucinations for the last 3 to 4 days, altered mental status.  Patient fell out of bed around October 04, 2021.  Patient came to the emergency room on 3/12 due to confusion and hallucination.  CT head showed bilateral subdural hemorrhage with trace left-sided subdural hemorrhage.  2 mm left to right midline shift.  Patient has been evaluated by Dr. Lacinda Axon from neurosurgery, scheduled surgery on 3/12. ? ?Assessment and Plan: ?* Subdural hematoma ?Patient still has some confusion and occasional hallucination.  Currently followed in stepdown unit.  But patient clinically stable for surgery.  Anticipating neurosurgery today. ? ?Subarachnoid hemorrhage ([place]) ?Trace amount of subarachnoid hemorrhage.  Followed by neurosurgery. ? ?CKD (chronic kidney disease) stage 4, GFR 15-29 ml/min (HCC) ?Renal function still stable. ? ?OAB (overactive bladder) ?Stable. ? ?Left acoustic neuroma ([place]) ?Chronic. ? ?GERD (gastroesophageal reflux disease) ?Continue ppi. ? ?Essential hypertension ?Stable. ? ? ? ? ?  ? ?Subjective:  ?Patient still has significant confusion and occasional hallucination. ?She has unsteady gait at baseline. ?Denies any short of breath or cough. ? ?Physical Exam: ?Vitals:  ? 10/17/21 0500 10/17/21 0732 10/17/21 0912 10/17/21 1000  ?BP:  (!) 151/67 (!) 153/76 (!) 161/140  ?Pulse:  69 78 64  ?Resp:  '16 20 13  '$ ?Temp:  97.7 ?F (36.5 ?C) 98.1 ?F (36.7 ?C) 98.6 ?F (37 ?C)  ?TempSrc:   Oral Oral  ?SpO2:  97% 100% 100%  ?Weight: 58.8 kg  60.3 kg   ?Height: 5' 3.5" (1.613 m)     ? ?General exam: Appears calm and comfortable  ?Respiratory system: Clear to auscultation. Respiratory effort  normal. ?Cardiovascular system: S1 & S2 heard, RRR. No JVD, murmurs, rubs, gallops or clicks. No pedal edema. ?Gastrointestinal system: Abdomen is nondistended, soft and nontender. No organomegaly or masses felt. Normal bowel sounds heard. ?Central nervous system: Alert and oriented. No focal neurological deficits. ?Extremities: Symmetric 5 x 5 power. ?Skin: No rashes, lesions or ulcers ?Psychiatry: Judgement and insight appear normal. Mood & affect appropriate.  ? ?Data Reviewed: ?Reviewed CT head results, reviewed all labs ? ?Family Communication: Boyfriend at bedside, ? ?Disposition: ?Status is: Inpatient ?Remains inpatient appropriate because: Severity of disease, pending procedure. ? Planned Discharge Destination: Home with Home Health ? ? ? ?Time spent: 28 minutes ? ?Author: ?Sharen Hones, MD ?10/17/2021 11:15 AM ? ?For on call review [email or web address].  ?

## 2021-10-17 NOTE — Transfer of Care (Signed)
Immediate Anesthesia Transfer of Care Note ? ?Patient: Aimee Oliver ? ?Procedure(s) Performed: BURR HOLES (Bilateral) ? ?Patient Location: PACU ? ?Anesthesia Type:General ? ?Level of Consciousness: awake ? ?Airway & Oxygen Therapy: Patient Spontanous Breathing and Patient connected to face mask oxygen ? ?Post-op Assessment: Report given to RN and Post -op Vital signs reviewed and stable ? ?Post vital signs: Reviewed and stable ? ?Last Vitals:  ?Vitals Value Taken Time  ?BP 138/71 10/17/21 1330  ?Temp    ?Pulse 73 10/17/21 1341  ?Resp 10 10/17/21 1341  ?SpO2 100 % 10/17/21 1341  ?Vitals shown include unvalidated device data. ? ?Last Pain:  ?Vitals:  ? 10/17/21 1020  ?TempSrc:   ?PainSc: 8   ?   ? ?  ? ?Complications: No notable events documented. ?

## 2021-10-17 NOTE — Hospital Course (Addendum)
81 year old female with a history of hypertension, CKD stage IV, history of acoustic schwannoma with chronic vertigo presents to the ER today with visual hallucinations for the last 3 to 4 days, altered mental status.  Patient fell out of bed around October 04, 2021.  Patient came to the emergency room on 3/12 due to confusion and hallucination.  CT head showed bilateral subdural hemorrhage with trace left-sided subdural hemorrhage.  2 mm left to right midline shift.  Patient has been evaluated by Dr. Lacinda Axon from neurosurgery, bilateral burr hole craniectomy with subdural evacuation performed on 3/12.  ?

## 2021-10-17 NOTE — H&P (Addendum)
History and Physical    Aimee Oliver IDP:[id number] DOB: 11-Jan-1941 DOA: 10/16/2021  DOS: the patient was seen and examined on 10/16/2021  PCP: Dayton Martes Victoriano Lain, NP   Patient coming from: Home  I have personally briefly reviewed patient's old medical records in [place]  CC: headache, visual hallucinations HPI: 81 year old female with a history of hypertension, CKD stage IV, history of acoustic schwannoma with chronic vertigo presents to the ER today with visual hallucinations for the last 3 to 4 days, altered mental status.  Patient fell out of bed around October 04, 2021.  She was seen in the primary care office on October 05, 2021 for routine care.  No imaging was performed.  Over the last week, the patient's significant other Bunnie Pion has noted the patient being increasingly altered.  She has had visual hallucinations for the last 3 days.  She has been seeing her grandchildren in her home.  She is also complaining of a severe headache.  No nausea, vomiting.  No fever, chills.  Patient brought to the ER today due to hallucinations.  Labs showed stable creatinine of 1.56, sodium 132, BUN of 33  CBC showed white count of 8.5, hemoglobin of 8.3, platelets of 258 hemoglobin stable.  Hemoglobin back in December 2022 was 8.1.  Of significance is the patient CT head which showed bilateral subdural hemorrhages measuring up to 1.4 cm bilaterally.  There is also some heterogenicity in the blood products on the left side suggesting acute component.  There is also trace left subarachnoid hemorrhage.  There is a 2 mm left to right midline shift.  There is partial effacement of the cerebral sulci.  EDP is consulted Dr. Lacinda Axon who personally evaluated the patient in the ED.  He and I both have agreed that the patient will be admitted by the hospitalist service.  Patient and significant other want to proceed with surgery for subdural hematoma drainage.  Dr. Lacinda Axon has agreed to assume care  as primary attending after surgery.  [facility] to admit the patient.   ED Course: CT head showed bilateral subdural hematomas with a 2 mm midline shift.  Also trace left subarachnoid hemorrhage.  Review of Systems:  Review of Systems  Unable to perform ROS: Mental acuity   Past Medical History:  Diagnosis Date   Acoustic neuroma ([place])    Anemia    boarderline anemic   Arthritis    Headache    sinus head aches   Hypercholesterolemia    Hypertension    PONV (postoperative nausea and vomiting)    with hysterectomy    Past Surgical History:  Procedure Laterality Date   ABDOMINAL HYSTERECTOMY     CATARACT EXTRACTION     FRACTURE SURGERY Left 2015   wrist   FRACTURE SURGERY Left 1956   ankle   IR FLUORO GUIDED NEEDLE PLC ASPIRATION/INJECTION LOC  01/25/2017   PLACEMENT OF BREAST IMPLANTS     Right total knee arthroplasty  08/13/2010   Dr. Marry Guan   TOTAL HIP ARTHROPLASTY Right 01/27/2017   Procedure: TOTAL HIP ARTHROPLASTY;  Surgeon: Dereck Leep, MD;  Location: [facility] ORS;  Service: Orthopedics;  Laterality: Right;     reports that she has never smoked. She has never used smokeless tobacco. She reports current alcohol use. She reports that she does not use drugs.  No Known Allergies  Family History  Problem Relation Age of Onset   Stomach cancer Mother    Leukemia Father  Prior to Admission medications   Medication Sig Start Date End Date Taking? Authorizing Provider  acetaminophen (TYLENOL) 650 MG CR tablet Take 1,300 mg by mouth 3 (three) times daily.   Yes [provider]  aspirin 81 MG EC tablet Take 81 mg by mouth daily.   Yes [provider]  Calcium Carbonate-Vit D-Min (CALTRATE 600+D PLUS PO) Take 1 tablet by mouth daily.   Yes [provider]  Coenzyme Q10 100 MG capsule Take 100 mg by mouth daily.   Yes [provider]  Cranberry-Vitamin C-Probiotic (AZO CRANBERRY PO) Take 1 tablet by mouth daily as needed (for  UTI prevention).   Yes [provider]  fluticasone (FLONASE) 50 MCG/ACT nasal spray Place 1 spray into both nostrils 2 (two) times daily. 10/05/21  Yes [provider]  Lactobacillus (ACIDOPHILUS PO) Take 1 capsule by mouth daily.   Yes [provider]  loratadine (CLARITIN) 10 MG tablet Take 10 mg by mouth daily. 10/05/21  Yes [provider]  meclizine (ANTIVERT) 12.5 MG tablet Take 12.5 mg by mouth 3 (three) times daily as needed. 10/05/21  Yes [provider]  metoprolol succinate (TOPROL-XL) 50 MG 24 hr tablet Take 25 mg by mouth at bedtime. 12/06/16  Yes [provider]  Multiple Vitamin (MULTIVITAMIN WITH MINERALS) TABS tablet Take 1 tablet by mouth daily. One-A-Day Women's 50+   Yes [provider]  omeprazole (PRILOSEC) 20 MG capsule Take 20 mg by mouth daily before breakfast. 12/06/16  Yes [provider]  Alfred Take 1 tablet by mouth 2 (two) times daily. PHYSIO OMEGA (a blend of DPA, EPA, & DHA FATTY ACIDS)   Yes [provider]  OVER THE COUNTER MEDICATION 1 tablet at bedtime. Applied nutrition Anti-Aging total body daily defense, Co Q10, Vitamin D3, Resveratrol, ginkgo, green tea, lutein and Omega-3 oils   Yes [provider]  oxybutynin (DITROPAN) 5 MG tablet Take 5 mg by mouth 3 (three) times daily. 06/12/21  Yes [provider]  rosuvastatin (CRESTOR) 40 MG tablet Take 40 mg by mouth at bedtime.   Yes [provider]    Physical Exam: Vitals:   10/16/21 2124 10/16/21 2245 10/16/21 2300 10/16/21 2330  BP: (!) 166/86 132/85 (!) 171/62 138/86  Pulse: 91 88 83 92  Resp: '18 18 19 20  '$ Temp: 98.3 F (36.8 C)     TempSrc: Oral     SpO2: 100% 98% 96% 96%  Weight:      Height:        Physical Exam Vitals and nursing note reviewed.  Constitutional:      General: She is not in acute distress.    Appearance: Normal appearance. She is normal weight. She is not  toxic-appearing.  HENT:     Head: Normocephalic and atraumatic.  Eyes:     Comments: Evidence of prior cataract surgery.  Left pupil is somewhat less reactive than the right.  Approximately 3 mm dilation compared to the right pupil which is about 2 mm.  Cardiovascular:     Rate and Rhythm: Normal rate and regular rhythm.     Pulses: Normal pulses.  Pulmonary:     Effort: Pulmonary effort is normal. No respiratory distress.     Breath sounds: Normal breath sounds. No wheezing or rales.  Abdominal:     General: Abdomen is flat. Bowel sounds are normal. There is no distension.     Palpations: Abdomen is soft.  Tenderness: There is no abdominal tenderness. There is no guarding.  Skin:    General: Skin is warm and dry.     Capillary Refill: Capillary refill takes less than 2 seconds.  Neurological:     General: No focal deficit present.     Mental Status: She is alert and oriented to person, place, and time.     Comments: Patient is alert to situation.  She knew she fell approximately 2 weeks ago.  She knows it is 2023.  She recognizes her significant other.  However she does not recall having visual hallucinations over the last several days.     Labs on Admission: I have personally reviewed following labs and imaging studies  CBC: Recent Labs  Lab 10/16/21 2125  WBC 8.5  NEUTROABS 6.3  HGB 8.3*  HCT 26.1*  MCV 105.7*  PLT 063   Basic Metabolic Panel: Recent Labs  Lab 10/16/21 2125  NA 132*  K 3.5  CL 102  CO2 20*  GLUCOSE 123*  BUN 33*  CREATININE 1.56*  CALCIUM 9.5   GFR: Estimated Creatinine Clearance: 25.5 mL/min (A) (by C-G formula based on SCr of 1.56 mg/dL (H)). Liver Function Tests: Recent Labs  Lab 10/16/21 2125  AST 47*  ALT 31  ALKPHOS 58  BILITOT 1.0  PROT 7.4  ALBUMIN 4.1   No results for input(s): LIPASE, AMYLASE in the last 168 hours. No results for input(s): AMMONIA in the last 168 hours. Coagulation Profile: Recent Labs  Lab  10/16/21 2245  INR 0.9   Cardiac Enzymes: No results for input(s): CKTOTAL, CKMB, CKMBINDEX, TROPONINI, TROPONINIHS in the last 168 hours. BNP (last 3 results) No results for input(s): PROBNP in the last 8760 hours. HbA1C: No results for input(s): HGBA1C in the last 72 hours. CBG: No results for input(s): GLUCAP in the last 168 hours. Lipid Profile: No results for input(s): CHOL, HDL, LDLCALC, TRIG, CHOLHDL, LDLDIRECT in the last 72 hours. Thyroid Function Tests: Recent Labs    10/16/21 2125  TSH 4.061   Anemia Panel: No results for input(s): VITAMINB12, FOLATE, FERRITIN, TIBC, IRON, RETICCTPCT in the last 72 hours. Urine analysis:    Component Value Date/Time   COLORURINE YELLOW (A) 10/16/2021 2145   APPEARANCEUR HAZY (A) 10/16/2021 2145   LABSPEC 1.013 10/16/2021 2145   PHURINE 5.0 10/16/2021 2145   GLUCOSEU NEGATIVE 10/16/2021 2145   HGBUR MODERATE (A) 10/16/2021 2145   BILIRUBINUR NEGATIVE 10/16/2021 2145   KETONESUR 5 (A) 10/16/2021 2145   PROTEINUR 100 (A) 10/16/2021 2145   NITRITE NEGATIVE 10/16/2021 2145   LEUKOCYTESUR TRACE (A) 10/16/2021 2145    Radiological Exams on Admission: I have personally reviewed images CT HEAD WO CONTRAST (5MM)  Result Date: 10/16/2021 CLINICAL DATA:  Head trauma, minor (Age >= 65y); Neck trauma (Age >= 65y). Status post fall EXAM: CT HEAD WITHOUT CONTRAST CT CERVICAL SPINE WITHOUT CONTRAST TECHNIQUE: Multidetector CT imaging of the head and cervical spine was performed following the standard protocol without intravenous contrast. Multiplanar CT image reconstructions of the cervical spine were also generated. RADIATION DOSE REDUCTION: This exam was performed according to the departmental dose-optimization program which includes automated exposure control, adjustment of the mA and/or kV according to patient size and/or use of iterative reconstruction technique. COMPARISON:  MRI head 06/10/2021, CT head 05/12/2014 FINDINGS: CT HEAD FINDINGS  Brain: No evidence of large-territorial acute infarction. No parenchymal hemorrhage. No mass lesion. Bilateral subacute subdural hemorrhage along the calvarial convexities measuring up to 1.4 cm bilaterally. There  is heterogeneity with hyperdense blood products within the left subdural hematoma suggestive an acute component. Associated trace left subarachnoid hemorrhage (4:62). 2 mm left to right midline shift. Partial effacement of the cerebral sulci. No hydrocephalus. Basilar cisterns are patent. Vascular: No hyperdense vessel. Skull: No acute fracture or focal lesion. Sinuses/Orbits: Paranasal sinuses and mastoid air cells are clear. The orbits are unremarkable. Other: None. CT CERVICAL SPINE FINDINGS Alignment: Grade 1 anterolisthesis of C3 on C4. Skull base and vertebrae: Multilevel severe degenerative changes spine most prominent at the poor through C7 levels. No definite severe osseous neural foraminal or central canal stenosis. At least moderate left C5-C6 osseous neural foraminal stenosis. No acute fracture. No aggressive appearing focal osseous lesion or focal pathologic process. Soft tissues and spinal canal: No prevertebral fluid or swelling. No visible canal hematoma. Upper chest: Biapical pleural/pulmonary scarring. Other: None. IMPRESSION: 1. Bilateral subacute subdural hemorrhages measuring up to 1.4 cm with an acute component on the left. Associated 2 mm left-to-right midline shift. 2. Trace acute left subarachnoid hemorrhage. 3. No acute displaced fracture or traumatic listhesis of the cervical spine. These results were called by telephone at the time of interpretation on 10/16/2021 at 10:13 pm to provider [facility] , who verbally acknowledged these results. Electronically Signed   By: Iven Finn M.D.   On: 10/16/2021 22:20   CT Cervical Spine Wo Contrast  Result Date: 10/16/2021 CLINICAL DATA:  Head trauma, minor (Age >= 65y); Neck trauma (Age >= 65y). Status post fall EXAM: CT HEAD  WITHOUT CONTRAST CT CERVICAL SPINE WITHOUT CONTRAST TECHNIQUE: Multidetector CT imaging of the head and cervical spine was performed following the standard protocol without intravenous contrast. Multiplanar CT image reconstructions of the cervical spine were also generated. RADIATION DOSE REDUCTION: This exam was performed according to the departmental dose-optimization program which includes automated exposure control, adjustment of the mA and/or kV according to patient size and/or use of iterative reconstruction technique. COMPARISON:  MRI head 06/10/2021, CT head 05/12/2014 FINDINGS: CT HEAD FINDINGS Brain: No evidence of large-territorial acute infarction. No parenchymal hemorrhage. No mass lesion. Bilateral subacute subdural hemorrhage along the calvarial convexities measuring up to 1.4 cm bilaterally. There is heterogeneity with hyperdense blood products within the left subdural hematoma suggestive an acute component. Associated trace left subarachnoid hemorrhage (4:62). 2 mm left to right midline shift. Partial effacement of the cerebral sulci. No hydrocephalus. Basilar cisterns are patent. Vascular: No hyperdense vessel. Skull: No acute fracture or focal lesion. Sinuses/Orbits: Paranasal sinuses and mastoid air cells are clear. The orbits are unremarkable. Other: None. CT CERVICAL SPINE FINDINGS Alignment: Grade 1 anterolisthesis of C3 on C4. Skull base and vertebrae: Multilevel severe degenerative changes spine most prominent at the poor through C7 levels. No definite severe osseous neural foraminal or central canal stenosis. At least moderate left C5-C6 osseous neural foraminal stenosis. No acute fracture. No aggressive appearing focal osseous lesion or focal pathologic process. Soft tissues and spinal canal: No prevertebral fluid or swelling. No visible canal hematoma. Upper chest: Biapical pleural/pulmonary scarring. Other: None. IMPRESSION: 1. Bilateral subacute subdural hemorrhages measuring up to 1.4  cm with an acute component on the left. Associated 2 mm left-to-right midline shift. 2. Trace acute left subarachnoid hemorrhage. 3. No acute displaced fracture or traumatic listhesis of the cervical spine. These results were called by telephone at the time of interpretation on 10/16/2021 at 10:13 pm to provider [facility] , who verbally acknowledged these results. Electronically Signed   By: Clelia Croft.D.  On: 10/16/2021 22:20    EKG: I have personally reviewed EKG: NSR    Assessment/Plan Principal Problem:   Subdural hematoma Active Problems:   Subarachnoid hemorrhage (HCC)   Essential hypertension   GERD (gastroesophageal reflux disease)   Left acoustic neuroma (HCC)   OAB (overactive bladder)   CKD (chronic kidney disease) stage 4, GFR 15-29 ml/min (HCC)    Assessment and Plan: * Subdural hematoma Admit to progressive care bed. Telemetry. Personally discussed with Dr. Lacinda Axon with neurosurgery. He is planning on hematoma drainage tomorrow. I see no contraindications to surgery. Pt and her significant other(David Braxton) are in agreement with surgery. Discussed with Dr. Lacinda Axon. Neurosurgery will assume care as primary attending after surgery. No heparin/lovenox/antiplatelet agents. SCDs for DVT prophylaxis.  All of the patient's chronic medical issues are not the cause of her altered mental status and visual hallucinations.  Subarachnoid hemorrhage ([place]) Management per neurosurgery.  CKD (chronic kidney disease) stage 4, GFR 15-29 ml/min (HCC) Chronic. Sees [facility].  OAB (overactive bladder) Stable.  Left acoustic neuroma (HCC) Chronic.  GERD (gastroesophageal reflux disease) Continue ppi.  Essential hypertension Stable.    DVT prophylaxis: SCDs Code Status: Full Code Family Communication: discussed with pt and her significant other david braxton  Disposition Plan: return home. May need home health  Consults called: neurosurgery Dr.  Lacinda Axon  Admission status: Inpatient,  progressive.   Kristopher Oppenheim, DO [facility] 10/17/2021, 12:25 AM

## 2021-10-17 NOTE — Anesthesia Preprocedure Evaluation (Signed)
Anesthesia Evaluation  ?Patient identified by MRN, date of birth, ID band ?Patient awake ? ? ? ?Reviewed: ?Allergy & Precautions, H&P , NPO status , Patient's Chart, lab work & pertinent test results, reviewed documented beta blocker date and time  ? ?History of Anesthesia Complications ?(+) PONV and history of anesthetic complications ? ?Airway ?Mallampati: II ? ?TM Distance: >3 FB ?Neck ROM: full ? ? ? Dental ? ?(+) Teeth Intact ?  ?Pulmonary ?neg pulmonary ROS,  ?  ?Pulmonary exam normal ? ? ? ? ? ? ? Cardiovascular ?Exercise Tolerance: Poor ?hypertension, On Medications ?negative cardio ROS ?Normal cardiovascular exam ?Rhythm:regular Rate:Normal ? ? ?  ?Neuro/Psych ? Headaches,  Neuromuscular disease negative psych ROS  ? GI/Hepatic ?Neg liver ROS, GERD  Medicated,  ?Endo/Other  ?negative endocrine ROS ? Renal/GU ?Renal disease  ?negative genitourinary ?  ?Musculoskeletal ? ? Abdominal ?  ?Peds ? Hematology ? ?(+) Blood dyscrasia, anemia ,   ?Anesthesia Other Findings ?Past Medical History: ?No date: Acoustic neuroma [facility]) ?No date: Anemia ?    Comment:  boarderline anemic ?No date: Arthritis ?No date: Headache ?    Comment:  sinus head aches ?No date: Hypercholesterolemia ?No date: Hypertension ?No date: PONV (postoperative nausea and vomiting) ?    Comment:  with hysterectomy ?Past Surgical History: ?No date: ABDOMINAL HYSTERECTOMY ?No date: CATARACT EXTRACTION ?2015: FRACTURE SURGERY; Left ?    Comment:  wrist ?1956: FRACTURE SURGERY; Left ?    Comment:  ankle ?01/25/2017: IR FLUORO GUIDED NEEDLE PLC ASPIRATION/INJECTION LOC ?No date: PLACEMENT OF BREAST IMPLANTS ?08/13/2010: Right total knee arthroplasty ?    Comment:  Dr. Marry Guan ?01/27/2017: TOTAL HIP ARTHROPLASTY; Right ?    Comment:  Procedure: TOTAL HIP ARTHROPLASTY;  Surgeon: Marry Guan,  ?             Laurice Record, MD;  Location: [facility] ORS;  Service: Orthopedics;  ?             Laterality: Right; ?BMI   ? Body Mass Index: 23.18  kg/m?  ?  ? Reproductive/Obstetrics ?negative OB ROS ? ?  ? ? ? ? ? ? ? ? ? ? ? ? ? ?  ?  ? ? ? ? ? ? ? ? ?Anesthesia Physical ?Anesthesia Plan ? ?ASA: 3 and emergent ? ?Anesthesia Plan: General ETT  ? ?Post-op Pain Management:   ? ?Induction:  ? ?PONV Risk Score and Plan: 4 or greater ? ?Airway Management Planned:  ? ?Additional Equipment:  ? ?Intra-op Plan:  ? ?Post-operative Plan:  ? ?Informed Consent: I have reviewed the patients History and Physical, chart, labs and discussed the procedure including the risks, benefits and alternatives for the proposed anesthesia with the patient or authorized representative who has indicated his/her understanding and acceptance.  ? ? ? ?Dental Advisory Given ? ?Plan Discussed with: CRNA ? ?Anesthesia Plan Comments:   ? ? ? ? ? ? ?Anesthesia Quick Evaluation ? ?

## 2021-10-17 NOTE — Assessment & Plan Note (Signed)
Stable

## 2021-10-17 NOTE — Anesthesia Procedure Notes (Signed)
Procedure Name: Intubation ?Date/Time: 10/17/2021 11:36 AM ?Performed by: Aline Brochure, CRNA ?Pre-anesthesia Checklist: Patient identified, Patient being monitored, Timeout performed, Emergency Drugs available and Suction available ?Patient Re-evaluated:Patient Re-evaluated prior to induction ?Oxygen Delivery Method: Circle system utilized ?Preoxygenation: Pre-oxygenation with 100% oxygen ?Induction Type: IV induction and Rapid sequence ?Ventilation: Mask ventilation without difficulty ?Laryngoscope Size: 3 and McGraph ?Grade View: Grade I ?Tube type: Oral ?Tube size: 7.0 mm ?Number of attempts: 1 ?Airway Equipment and Method: Stylet and Video-laryngoscopy ?Placement Confirmation: ETT inserted through vocal cords under direct vision, positive ETCO2 and breath sounds checked- equal and bilateral ?Secured at: 24 cm ?Tube secured with: Tape ?Dental Injury: Teeth and Oropharynx as per pre-operative assessment  ? ? ? ? ?

## 2021-10-17 NOTE — Progress Notes (Addendum)
Patient alert and oriented x4. Drains intact bilaterally, draining. On arrival patient had 40 ml per drain. Kerlix wrap to head, with minimal drainage on arrival. Drainage marked on arrival, with no further evidence of bleeding. Neuro checks done per protocol. Patient is appropriate and at baseline. Patient cognition and confusion improved significantly from morning assessment. Will continue to monitor.  ?

## 2021-10-17 NOTE — Progress Notes (Signed)
Dr. Lacinda Axon by PACU to see pt.  Drains intact bilaterally with instructions not to empty.  Kerlix wrap intact to head with minimal amt of drainage. Neuro checks done. Pt appropriate and at baseline. ?

## 2021-10-18 ENCOUNTER — Inpatient Hospital Stay: Payer: Medicare HMO

## 2021-10-18 ENCOUNTER — Other Ambulatory Visit: Payer: Self-pay

## 2021-10-18 ENCOUNTER — Encounter: Payer: Self-pay | Admitting: Neurosurgery

## 2021-10-18 DIAGNOSIS — I10 Essential (primary) hypertension: Secondary | ICD-10-CM | POA: Diagnosis not present

## 2021-10-18 DIAGNOSIS — I609 Nontraumatic subarachnoid hemorrhage, unspecified: Secondary | ICD-10-CM | POA: Diagnosis not present

## 2021-10-18 DIAGNOSIS — S065XAA Traumatic subdural hemorrhage with loss of consciousness status unknown, initial encounter: Secondary | ICD-10-CM | POA: Diagnosis not present

## 2021-10-18 LAB — CBC WITH DIFFERENTIAL/PLATELET
Abs Immature Granulocytes: 0.07 10*3/uL (ref 0.00–0.07)
Basophils Absolute: 0 10*3/uL (ref 0.0–0.1)
Basophils Relative: 0 %
Eosinophils Absolute: 0 10*3/uL (ref 0.0–0.5)
Eosinophils Relative: 0 %
HCT: 22.5 % — ABNORMAL LOW (ref 36.0–46.0)
Hemoglobin: 7.2 g/dL — ABNORMAL LOW (ref 12.0–15.0)
Immature Granulocytes: 1 %
Lymphocytes Relative: 7 %
Lymphs Abs: 0.4 10*3/uL — ABNORMAL LOW (ref 0.7–4.0)
MCH: 33.6 pg (ref 26.0–34.0)
MCHC: 32 g/dL (ref 30.0–36.0)
MCV: 105.1 fL — ABNORMAL HIGH (ref 80.0–100.0)
Monocytes Absolute: 0.7 10*3/uL (ref 0.1–1.0)
Monocytes Relative: 11 %
Neutro Abs: 5.1 10*3/uL (ref 1.7–7.7)
Neutrophils Relative %: 81 %
Platelets: 222 10*3/uL (ref 150–400)
RBC: 2.14 MIL/uL — ABNORMAL LOW (ref 3.87–5.11)
RDW: 14.2 % (ref 11.5–15.5)
WBC: 6.3 10*3/uL (ref 4.0–10.5)
nRBC: 0 % (ref 0.0–0.2)

## 2021-10-18 LAB — GLUCOSE, CAPILLARY: Glucose-Capillary: 113 mg/dL — ABNORMAL HIGH (ref 70–99)

## 2021-10-18 LAB — BASIC METABOLIC PANEL
Anion gap: 8 (ref 5–15)
BUN: 22 mg/dL (ref 8–23)
CO2: 19 mmol/L — ABNORMAL LOW (ref 22–32)
Calcium: 8.5 mg/dL — ABNORMAL LOW (ref 8.9–10.3)
Chloride: 111 mmol/L (ref 98–111)
Creatinine, Ser: 1.29 mg/dL — ABNORMAL HIGH (ref 0.44–1.00)
GFR, Estimated: 41 mL/min — ABNORMAL LOW (ref >60)
Glucose, Bld: 120 mg/dL — ABNORMAL HIGH (ref 70–99)
Potassium: 4.4 mmol/L (ref 3.5–5.1)
Sodium: 138 mmol/L (ref 135–145)

## 2021-10-18 LAB — MAGNESIUM: Magnesium: 1.9 mg/dL (ref 1.7–2.4)

## 2021-10-18 MED ORDER — ORAL CARE MOUTH RINSE
15.0000 mL | Freq: Two times a day (BID) | OROMUCOSAL | Status: DC
  Administered 2021-10-18 (×2): 15 mL via OROMUCOSAL

## 2021-10-18 MED ORDER — LIDOCAINE HCL 1 % IJ SOLN: 10.0000 mL | Freq: Once | INTRAMUSCULAR | Status: AC

## 2021-10-18 MED ORDER — LIDOCAINE HCL 1 % IJ SOLN
INTRAMUSCULAR | Status: AC
  Administered 2021-10-18: 10 mL
  Filled 2021-10-18: qty 10

## 2021-10-18 MED ORDER — LIDOCAINE HCL (PF) 1 % IJ SOLN
5.0000 mL | Freq: Once | INTRAMUSCULAR | Status: DC
  Filled 2021-10-18: qty 5

## 2021-10-18 NOTE — Progress Notes (Addendum)
? ?   Attending Progress Note ? ?History: Aimee Oliver is s/p bilateral burr holes for SDH. ? ?POD1: NAEO. Reports improvement since surgery. Denying pain.  ? ?Physical Exam: ?Vitals:  ? 10/18/21 0600 10/18/21 0700  ?BP: 107/63 126/83  ?Pulse: 66 73  ?Resp: 20 15  ?Temp:    ?SpO2: 92% 96%  ? ? ?AA Ox3 ?CNI ?Strength:5/5 throughout  ?No pronator drift ?Drain output 150 (R) 125 (L) since surgery. ? ?Data: ? ?Recent Labs  ?Lab 10/16/21 ?2125 10/17/21 ?0415 10/18/21 ?5027  ?NA 132* 138 138  ?K 3.5 4.3 4.4  ?CL 102 109 111  ?CO2 20* 22 19*  ?BUN 33* 30* 22  ?CREATININE 1.56* 1.42* 1.29*  ?GLUCOSE 123* 104* 120*  ?CALCIUM 9.5 9.1 8.5*  ? ?Recent Labs  ?Lab 10/17/21 ?0415  ?AST 42*  ?ALT 27  ?ALKPHOS 49  ?  ? Recent Labs  ?Lab 10/16/21 ?2125 10/17/21 ?0415 10/18/21 ?7412  ?WBC 8.5 6.5 6.3  ?HGB 8.3* 7.5* 7.2*  ?HCT 26.1* 23.3* 22.5*  ?PLT 258 240 222  ? ?Recent Labs  ?Lab 10/16/21 ?2245  ?APTT 23*  ?INR 0.9  ?  ?   ? ? ?Other tests/results: none  ? ?Assessment/Plan: ? ?Aimee Oliver a 81 y.o presenting after a remote fall with balance and dizziness. 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 She underwent bilateral burr holes for evacuation on 3/12 with Dr. Lacinda Axon ? ?- mobilize ?- pain control; avoid over sedation ?- will obtain updated head CT today. If improved will liberalize mobility status. ?- remainder of care per admitting team.  ?- please call neurosurgery with any questions or concerns.  ? ?Cooper Render PA-C ?Department of Neurosurgery ? ?  ?

## 2021-10-18 NOTE — Progress Notes (Signed)
Pleasantly A&OX4 this AM but can be mildly forgetful. No other Neurologic deficits noted. Symmetrical movement in all extremities. Pt to and from repeat CT scan this shift. Up to chair for several hours this morning. Mild dizziness with transfer but otherwise tolerated well. Walked in hall with PT. Bilateral drains removed by PA at bedside with RN to assist. No drainage noted from sutured drain sites since removal. Pt instructed not to touch sites and verbalizes understanding. Afebrile this shift. Pt continues to progress in POC goals. Will continue to monitor. ?

## 2021-10-18 NOTE — Progress Notes (Signed)
Pt arrived to unit 1856. A&O x4. ?

## 2021-10-18 NOTE — Progress Notes (Signed)
?  Progress Note ? ? ?Patient: Aimee Oliver UKG:[id number] DOB: 10-03-1940 DOA: 10/16/2021     2 ?DOS: the patient was seen and examined on 10/18/2021 ?  ?Brief hospital course: ?81 year old female with a history of hypertension, CKD stage IV, history of acoustic schwannoma with chronic vertigo presents to the ER today with visual hallucinations for the last 3 to 4 days, altered mental status.  Patient fell out of bed around October 04, 2021.  Patient came to the emergency room on 3/12 due to confusion and hallucination.  CT head showed bilateral subdural hemorrhage with trace left-sided subdural hemorrhage.  2 mm left to right midline shift.  Patient has been evaluated by Dr. Lacinda Axon from neurosurgery, bilateral burr hole craniectomy with subdural evacuation performed on 3/12.  ? ?Assessment and Plan: ?* Subdural hematoma ?Surgery performed yesterday, patient still has drains.  Mental status has improved today.  We will transfer to medical floor once drain is out by neurosurgery. ? ?Subarachnoid hemorrhage ([place]) ?Trace amount of subarachnoid hemorrhage.  Followed by neurosurgery. ? ?CKD (chronic kidney disease) stage 4, GFR 15-29 ml/min (HCC) ?Renal function is stable. ? ?OAB (overactive bladder) ?Stable. ? 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 ? ?GERD (gastroesophageal reflux disease) ?Continue ppi. ? ?Essential hypertension ?Continue current treatment. ? ? ? ? ?  ? ?Subjective:  ?Patient feels much better.  No longer has any confusion or hallucination. ?No headache or dizziness. ? ? ?Physical Exam: ?Vitals:  ? 10/18/21 0800 10/18/21 0900 10/18/21 1000 10/18/21 1300  ?BP: 127/73 118/64 118/67 105/68  ?Pulse: 70 80 79 74  ?Resp: '15 15 13 19  '$ ?Temp: 97.8 ?F (36.6 ?C)   97.8 ?F (36.6 ?C)  ?TempSrc: Oral   Oral  ?SpO2: 100% 97% 100% 100%  ?Weight:      ?Height:      ? ?General exam: Appears calm and comfortable  ?Respiratory system: Clear to auscultation. Respiratory effort normal. ?Cardiovascular system: S1 & S2 heard, RRR.  No JVD, murmurs, rubs, gallops or clicks. No pedal edema. ?Gastrointestinal system: Abdomen is nondistended, soft and nontender. No organomegaly or masses felt. Normal bowel sounds heard. ?Central nervous system: Alert and oriented. No focal neurological deficits. ?Extremities: Symmetric 5 x 5 power. ?Skin: No rashes, lesions or ulcers ?Psychiatry: Judgement and insight appear normal. Mood & affect appropriate.  ? ?Data Reviewed: ?Reviewed the CT scan results as well as lab results. ? ?Family Communication:  ? ?Disposition: ?Status is: Inpatient ?Remains inpatient appropriate because: Severity of disease ? Planned Discharge Destination: Home with Home Health ? ? ? ?Time spent: 27 minutes ? ?Author: ?Sharen Hones, MD ?10/18/2021 1:21 PM ? ?For on call review [email or web address].  ?

## 2021-10-18 NOTE — Evaluation (Signed)
Physical Therapy Evaluation ?Patient Details ?Name: Aimee Oliver ?MRN: [id number] ?DOB: 25-Sep-1940 ?Today's Date: 10/18/2021 ? ?History of Present Illness ? Pt is an 81 y.o. female who presents to the ED on 3/12 due to confusion and hallucination. Per patient and partner, she fell out of bed around February 27 and has noticed a decline in mental status since. Admitted for bilateral subdural hematoma, subarachnoid hemorrhage, and bilateral burr hole craniectomy w/ subdural evacuation. PmHx: HTN, HLD, GERD, CKD, Hx of Acoustic Scwannoma w/ Chronic Vertigo. ?  ?Clinical Impression ? Pt is awake resting in recliner upon PT entrance into room for evaluation today. Pt is A&Ox4 and denies any c/o pain at rest. Pt reports prior to hospitalization, she lives in a 2-story house w/ her partner w/ 1 STE and 15 steps to get to 2nd level. She is independent w/ ADLs and states she golfs 2-3x a week and only uses a SPC at night or PRN due to vertigo; she has a RW from previous knee surgery. ? ?Pt is able to perform sit to stand from recliner w/ CGA using RW. She is able to progress to ambulating ~147f w/ CGA using RW and reports no c/o increased negative symptoms throughout. Upon returning to her room, she requests to return to bed, and performs sit to supine w/ CGA. Pt will benefit from continued skilled PT in order to increase LE strength, improve balance/mobility/gait, decrease c/o dizziness, and restore PLOF. Current discharge recommendation to HHPT is appropriate due to the level of assistance required by the patient to ensure safety and improve overall function. ? ?   ? ?Recommendations for follow up therapy are one component of a multi-disciplinary discharge planning process, led by the attending physician.  Recommendations may be updated based on patient status, additional functional criteria and insurance authorization. ? ?Follow Up Recommendations Home health PT ? ?  ?Assistance Recommended at Discharge Intermittent  Supervision/Assistance  ?Patient can return home with the following ? Assistance with cooking/housework;A little help with bathing/dressing/bathroom;A little help with walking and/or transfers;Assist for transportation;Help with stairs or ramp for entrance ? ?  ?Equipment Recommendations None recommended by PT (Pt reports she has a RW at home)  ?Recommendations for Other Services ?    ?  ?Functional Status Assessment Patient has had a recent decline in their functional status and demonstrates the ability to make significant improvements in function in a reasonable and predictable amount of time.  ? ?  ?Precautions / Restrictions Precautions ?Precautions: Fall ?Restrictions ?Weight Bearing Restrictions: No  ? ?  ? ?Mobility ? Bed Mobility ?Overal bed mobility: Needs Assistance ?Bed Mobility: Sit to Supine ?  ?  ?  ?Sit to supine: Min guard ?  ?  ?  ? ?Transfers ?Overall transfer level: Needs assistance ?Equipment used: Rolling walker (2 wheels) ?Transfers: Sit to/from Stand ?Sit to Stand: Min guard ?  ?  ?  ?  ?  ?  ?  ? ?Ambulation/Gait ?Ambulation/Gait assistance: Min guard ?Gait Distance (Feet): 180 Feet ?Assistive device: Rolling walker (2 wheels) ?Gait Pattern/deviations: Decreased step length - right, Decreased step length - left, Step-through pattern, Decreased stride length ?Gait velocity: decreased ?  ?  ?  ? ?Stairs ?  ?  ?  ?  ?  ? ?Wheelchair Mobility ?  ? ?Modified Rankin (Stroke Patients Only) ?  ? ?  ? ?Balance Overall balance assessment: Needs assistance ?Sitting-balance support: Feet supported, No upper extremity supported, Bilateral upper extremity supported ?Sitting balance-Leahy Scale: Good ?  ?  ?  Standing balance support: Bilateral upper extremity supported, During functional activity, Reliant on assistive device for balance ?Standing balance-Leahy Scale: Fair ?  ?  ?  ?  ?  ?  ?  ?  ?  ?  ?  ?  ?   ? ? ? ?Pertinent Vitals/Pain Pain Assessment ?Pain Assessment: No/denies pain  ? ? ?Home Living  Family/patient expects to be discharged to:: Private residence ?Living Arrangements: Spouse/significant other ?Available Help at Discharge: Family;Available 24 hours/day ?Type of Home: House ?Home Access: Stairs to enter ?  ?Entrance Stairs-Number of Steps: 1 ?  ?Home Layout: Able to live on main level with bedroom/bathroom;Two level ?Home Equipment: Cane - single Barista (2 wheels);Shower seat - built in ?   ?  ?Prior Function   ?  ?  ?  ?  ?  ?  ?Mobility Comments: Pt reports she uses a cane at night, but has a RW from her previous knee surgery. ?  ?  ? ? ?Hand Dominance  ?   ? ?  ?Extremity/Trunk Assessment  ? Upper Extremity Assessment ?Upper Extremity Assessment: Overall WFL for tasks assessed ?  ? ?Lower Extremity Assessment ?Lower Extremity Assessment: Overall WFL for tasks assessed;Generalized weakness ?  ? ?   ?Communication  ?    ?Cognition Arousal/Alertness: Awake/alert ?Behavior During Therapy: [facility] for tasks assessed/performed ?Overall Cognitive Status: Within Functional Limits for tasks assessed ?  ?  ?  ?  ?  ?  ?  ?  ?  ?  ?  ?  ?  ?  ?  ?  ?General Comments: very pleasant ?  ?  ? ?  ?General Comments   ? ?  ?Exercises    ? ?Assessment/Plan  ?  ?PT Assessment Patient needs continued PT services  ?PT Problem List Decreased strength;Decreased mobility;Decreased range of motion;Decreased coordination;Decreased activity tolerance;Decreased balance ? ?   ?  ?PT Treatment Interventions DME instruction;Therapeutic exercise;Gait training;Balance training;Stair training;Neuromuscular re-education;Functional mobility training;Therapeutic activities;Patient/family education   ? ?PT Goals (Current goals can be found in the Care Plan section)  ?Acute Rehab PT Goals ?Patient Stated Goal: to go home ?PT Goal Formulation: With patient ?Time For Goal Achievement: 11/01/21 ?Potential to Achieve Goals: Good ? ?  ?Frequency Min 2X/week ?  ? ? ?Co-evaluation   ?  ?  ?  ?  ? ? ?  ?AM-PAC PT "6 Clicks" Mobility   ?Outcome Measure Help needed turning from your back to your side while in a flat bed without using bedrails?: A Little ?Help needed moving from lying on your back to sitting on the side of a flat bed without using bedrails?: A Little ?Help needed moving to and from a bed to a chair (including a wheelchair)?: A Little ?Help needed standing up from a chair using your arms (e.g., wheelchair or bedside chair)?: A Little ?Help needed to walk in hospital room?: A Little ?Help needed climbing 3-5 steps with a railing? : A Little ?6 Click Score: 18 ? ?  ?End of Session Equipment Utilized During Treatment: Gait belt ?Activity Tolerance: Patient tolerated treatment well ?Patient left: in bed;with call bell/phone within reach;with bed alarm set ?Nurse Communication: Mobility status ?PT Visit Diagnosis: Unsteadiness on feet (R26.81);Muscle weakness (generalized) (M62.81);History of falling (Z91.81) ?  ? ?Time: 1440-1510 ?PT Time Calculation (min) (ACUTE ONLY): 30 min ? ? ?Charges:     ?  ?  ?   ? ? ?Jonnie Kind, SPT ?10/18/2021, 4:09 PM ? ?

## 2021-10-18 NOTE — Anesthesia Postprocedure Evaluation (Signed)
Anesthesia Post Note ? ?Patient: Aimee Oliver ? ?Procedure(s) Performed: BURR HOLES (Bilateral) ? ?Patient location during evaluation: ICU ?Anesthesia Type: General ?Level of consciousness: awake and alert and oriented ?Pain management: pain level controlled ?Vital Signs Assessment: post-procedure vital signs reviewed and stable ?Respiratory status: spontaneous breathing ?Cardiovascular status: stable ?Postop Assessment: no apparent nausea or vomiting ?Anesthetic complications: no ? ? ?No notable events documented. ? ? ?Last Vitals:  ?Vitals:  ? 10/18/21 0600 10/18/21 0700  ?BP: 107/63 126/83  ?Pulse: 66 73  ?Resp: 20 15  ?Temp:    ?SpO2: 92% 96%  ?  ?Last Pain:  ?Vitals:  ? 10/18/21 0100  ?TempSrc: Oral  ?PainSc: 0-No pain  ? ? ?  ?  ?  ?  ?  ?  ? ?Sheily Lineman Lily Peer ? ? ? ? ?

## 2021-10-19 MED ORDER — OXYCODONE HCL 5 MG PO TABS: 2.5000 mg | ORAL_TABLET | Freq: Four times a day (QID) | ORAL | 0 refills | Status: AC | PRN

## 2021-10-19 NOTE — Discharge Summary (Signed)
?Physician Discharge Summary ?  ?Patient: Aimee Oliver MRN: [id number] DOB: Dec 24, 1940  ?Admit date:     10/16/2021  ?Discharge date: 10/19/21  ?Discharge Physician: Sharen Hones  ? ?PCP: Sallee Lange, NP  ? ?Recommendations at discharge:  ? ?Neurosurgery office will call you for appointment to be seen in 2 weeks, sutures will be removed at that time. ?Follow-up with PCP in 2 weeks ? ?Discharge Diagnoses: ?Principal Problem: ?  Subdural hematoma ?Active Problems: ?  Subarachnoid hemorrhage ([place]) ?  Essential hypertension ?  GERD (gastroesophageal reflux disease) ?  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  OAB (overactive bladder) ?  CKD (chronic kidney disease) stage 4, GFR 15-29 ml/min (HCC) ? ?Resolved Problems: ?  * No resolved hospital problems. * ? ?Hospital Course: ?81 year old female with a history of hypertension, CKD stage IV, history of acoustic schwannoma with chronic vertigo presents to the ER today with visual hallucinations for the last 3 to 4 days, altered mental status.  Patient fell out of bed around October 04, 2021.  Patient came to the emergency room on 3/12 due to confusion and hallucination.  CT head showed bilateral subdural hemorrhage with trace left-sided subdural hemorrhage.  2 mm left to right midline shift.  Patient has been evaluated by Dr. Lacinda Axon from neurosurgery, bilateral burr hole craniectomy with subdural evacuation performed on 3/12.  ? ?Assessment and Plan: ?* Subdural hematoma ?Drain has removed yesterday, patient currently no longer had any altered mental status.  Spoke with PA from neurosurgery, neurosurgical office will call patient for appointment to be seen in 2 weeks.  Continue pain medicine. ? ?Subarachnoid hemorrhage ([place]) ?Trace amount of subarachnoid hemorrhage.  No need for intervention at this time. ? ?CKD (chronic kidney disease) stage 4, GFR 15-29 ml/min (HCC) ?Renal function is stable. ? ?OAB (overactive bladder) ?Stable. ? 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 ? ?GERD  (gastroesophageal reflux disease) ?Continue ppi. ? ?Essential hypertension ?Continue current treatment. ? ? ?Patient evaluated by PT, recommend home care.  Patient and husband refused any home care. ? ?  ? ? ?Consultants: Neurosurgery ?Procedures performed:  craniectomy ?Disposition: Home ?Diet recommendation:  ?Discharge Diet Orders (From admission, onward)  ? ?  Start     Ordered  ? 10/19/21 0000  Diet - low sodium heart healthy       ? 10/19/21 1021  ? ?  ?  ? ?  ? ?Cardiac diet ?DISCHARGE MEDICATION: ?Allergies as of 10/19/2021   ?No Known Allergies ?  ? ?  ?Medication List  ?  ? ?STOP taking these medications   ? ?ACIDOPHILUS PO ?  ?aspirin 81 MG EC tablet ?  ?Coenzyme Q10 100 MG capsule ?  ?multivitamin with minerals Tabs tablet ?  ? ?  ? ?TAKE these medications   ? ?acetaminophen 650 MG CR tablet ?Commonly known as: TYLENOL ?Take 1,300 mg by mouth 3 (three) times daily. ?  ?AZO CRANBERRY PO ?Take 1 tablet by mouth daily as needed (for UTI prevention). ?  ?CALTRATE 600+D PLUS PO ?Take 1 tablet by mouth daily. ?  ?fluticasone 50 MCG/ACT nasal spray ?Commonly known as: FLONASE ?Place 2 sprays into both nostrils 2 (two) times daily. ?  ?loratadine 10 MG tablet ?Commonly known as: CLARITIN ?Take 10 mg by mouth daily. ?  ?meclizine 12.5 MG tablet ?Commonly known as: ANTIVERT ?Take 12.5 mg by mouth 3 (three) times daily as needed. ?  ?metoprolol succinate 50 MG 24 hr tablet ?Commonly known as: TOPROL-XL ?Take 25 mg by mouth  at bedtime. ?  ?omeprazole 20 MG capsule ?Commonly known as: PRILOSEC ?Take 20 mg by mouth daily before breakfast. ?  ?OVER THE COUNTER MEDICATION ?Take 1 tablet by mouth 2 (two) times daily. PHYSIO OMEGA (a blend of DPA, EPA, & DHA FATTY ACIDS) ?  ?OVER THE COUNTER MEDICATION ?1 tablet at bedtime. Applied nutrition Anti-Aging total body daily defense, Co Q10, Vitamin D3, Resveratrol, ginkgo, green tea, lutein and Omega-3 oils ?  ?oxybutynin 5 MG tablet ?Commonly known as: DITROPAN ?Take 5 mg by  mouth 3 (three) times daily. ?  ?oxyCODONE 5 MG immediate release tablet ?Commonly known as: Oxy IR/ROXICODONE ?Take 0.5 tablets (2.5 mg total) by mouth every 6 (six) hours as needed for moderate pain. ?  ?rosuvastatin 40 MG tablet ?Commonly known as: CRESTOR ?Take 40 mg by mouth at bedtime. ?  ? ?  ? ?  ?  ? ? ?  ?Discharge Care Instructions  ?(From admission, onward)  ?  ? ? ?  ? ?  Start     Ordered  ? 10/19/21 0000  Discharge wound care:       ?Comments: Neurosurgery will call you with an appointment to be seen in 2 weeks  ? 10/19/21 1021  ? ?  ?  ? ?  ? ? Follow-up Information   ? ? Gauger, Victoriano Lain, NP Follow up in 1 week(s).   ?Specialty: Internal Medicine ?Contact information: ?[address] [place] [address] ?[phone] ? ? ?  ?  ? ?  ?  ? ?  ? ?Discharge Exam: ?Filed Weights  ? 10/16/21 2123 10/17/21 0500 10/17/21 0912  ?Weight: 64.9 kg 58.8 kg 60.3 kg  ? ?General exam: Appears calm and comfortable  ?Respiratory system: Clear to auscultation. Respiratory effort normal. ?Cardiovascular system: S1 & S2 heard, RRR. No JVD, murmurs, rubs, gallops or clicks. No pedal edema. ?Gastrointestinal system: Abdomen is nondistended, soft and nontender. No organomegaly or masses felt. Normal bowel sounds heard. ?Central nervous system: Alert and oriented. No focal neurological deficits. ?Extremities: Symmetric 5 x 5 power. ?Skin: No rashes, lesions or ulcers ?Psychiatry: Judgement and insight appear normal. Mood & affect appropriate.  ? ? ?Condition at discharge: good ? ?The results of significant diagnostics from this hospitalization (including imaging, microbiology, ancillary and laboratory) are listed below for reference.  ? ?Imaging Studies: ?CT HEAD WO CONTRAST (5MM) ? ?Result Date: 10/18/2021 ?CLINICAL DATA:  Subdural hematoma EXAM: CT HEAD WITHOUT CONTRAST TECHNIQUE: Contiguous axial images were obtained from the base of the skull through the vertex without intravenous contrast. RADIATION DOSE  REDUCTION: This exam was performed according to the departmental dose-optimization program which includes automated exposure control, adjustment of the mA and/or kV according to patient size and/or use of iterative reconstruction technique. COMPARISON:  Head CT 10/16/2021. FINDINGS: Brain: Postoperative changes of bilateral subdural hematoma evacuation with frontal approach subdural drains in place. Significantly decreased size of the subdural collections. There is intracranial gas anteriorly with mass effect on the underlying frontal cortex, right greater than left. Overall, there is a reduction in overall mass effect after evacuation of the subdural hematomas with patency of the sulci and ventricular system. Unchanged hyperdense/acute blood products along the left parietal aspect. For reference, the left subdural hematoma measures at maximum 1.0 cm along the parietal aspect (coronal image 40).There is persistent minimal rightward midline shift measuring up to 2 mm. Vascular: No hyperdense vessel Skull: Bifrontal burr holes from subdural evacuation. Negative for fracture. Sinuses/Orbits: No acute finding. Other: None. IMPRESSION:  Decreased size of the bilateral subdural hematomas after evacuation. Intracranial gas anteriorly with mass effect on the underlying frontal cortex, right greater than left, but overall reduced mass effect on the cerebral hemispheres after hematoma drainage. Unchanged hyperdense/acute blood products along the left parietal aspect. Persistent minimal rightward midline shift measuring 2 mm. Electronically Signed   By: Maurine Simmering M.D.   On: 10/18/2021 08:30  ? ?CT HEAD WO CONTRAST (5MM) ? ?Result Date: 10/16/2021 ?CLINICAL DATA:  Head trauma, minor (Age >= 65y); Neck trauma (Age >= 65y). Status post fall EXAM: CT HEAD WITHOUT CONTRAST CT CERVICAL SPINE WITHOUT CONTRAST TECHNIQUE: Multidetector CT imaging of the head and cervical spine was performed following the standard protocol without  intravenous contrast. Multiplanar CT image reconstructions of the cervical spine were also generated. RADIATION DOSE REDUCTION: This exam was performed according to the departmental dose-optimization program which

## 2021-10-19 NOTE — TOC Initial Note (Signed)
Transition of Care (TOC) - Initial/Assessment Note  ? ? ?Patient Details  ?Name: Aimee Oliver ?MRN: [id number] ?Date of Birth: 1940/08/14 ? ?Transition of Care (TOC) CM/SW Contact:    ?Pete Pelt, RN ?Phone Number: ?10/19/2021, 11:31 AM ? ?Clinical Narrative:     Patient lives with significant other, who she states can provide assistance at home, transportation and go to pharmacy to obtain medications. ? ?Patient accepts home health for PT OT  ?RN, Dr. Roosevelt Locks to order Malachy Mood from [facility] accepts patient for home health services.  No eqiupment recommendations. ? ?Patient denies having other TOC needs at this time.  She is comfortable with discharge today.            ? ? ?Expected Discharge Plan: [place] ?Barriers to Discharge: Barriers Resolved ? ? ?Patient Goals and CMS Choice ?Patient states their goals for this hospitalization and ongoing recovery are:: To go home and get back to my best life ?  ?Choice offered to / list presented to : NA ? ?Expected Discharge Plan and Services ?Expected Discharge Plan: [place] ?  ?Discharge Planning Services: CM Consult ?Post Acute Care Choice: Home Health ?Living arrangements for the past 2 months: [place] ?Expected Discharge Date: 10/19/21               ?  ?  ?  ?  ?  ?HH Arranged: RN, OT, PT ?[place] Agency: [facility] ?Date HH Agency Contacted: 10/19/21 ?Time [place]: 1130 ?Representative spoke with at [place]: [place] ? ?Prior Living Arrangements/Services ?Living arrangements for the past 2 months: [place] ?Lives with:: Self, Significant Other ?Patient language and need for interpreter reviewed:: Yes (No interpreter required) ?Do you feel safe going back to the place where you live?: Yes      ?Need for Family Participation in Patient Care: Yes (Comment) ?Care giver support system in place?: Yes (comment) ?  ?Criminal Activity/Legal Involvement Pertinent to Current  Situation/Hospitalization: No - Comment as needed ? ?Activities of Daily Living ?Home Assistive Devices/Equipment: Kasandra Knudsen (specify quad or straight), Hearing aid ?ADL Screening (condition at time of admission) ?Patient's cognitive ability adequate to safely complete daily activities?: Yes ?Is the patient deaf or have difficulty hearing?: Yes ?Does the patient have difficulty seeing, even when wearing glasses/contacts?: No ?Does the patient have difficulty concentrating, remembering, or making decisions?: No ?Patient able to express need for assistance with ADLs?: Yes ?Does the patient have difficulty dressing or bathing?: No ?Independently performs ADLs?: Yes (appropriate for developmental age) ?Does the patient have difficulty walking or climbing stairs?: No ?Weakness of Legs: None ?Weakness of Arms/Hands: None ? ?Permission Sought/Granted ?Permission sought to share information with : Case Manager ?Permission granted to share information with : Yes, Verbal Permission Granted ?   ? Permission granted to share info w AGENCY: [facility] ?   ?   ? ?Emotional Assessment ?Appearance:: Appears stated age ?Attitude/Demeanor/Rapport: Gracious, Engaged ?Affect (typically observed): Pleasant, Appropriate ?Orientation: : Oriented to Self, Oriented to Place, Oriented to  Time, Oriented to Situation ?Alcohol / Substance Use: Not Applicable ?Psych Involvement: No (comment) ? ?Admission diagnosis:  Subdural hemorrhage ([place]) [I62.00] ?Subdural hematoma [S06.5XAA] ?Subarachnoid bleed ([place]) [I60.9] ?Altered mental status, unspecified altered mental status type [R41.82] ?Patient Active Problem List  ? Diagnosis Date Noted  ? Subarachnoid hemorrhage ([place]) 10/17/2021  ? Subdural hematoma 10/16/2021  ? Hyponatremia 07/08/2021  ? Essential hypertension 01/15/2021  ? GERD (gastroesophageal reflux  disease) 12/29/2020  ? OAB (overactive bladder) 12/29/2020  ? CKD (chronic kidney disease) stage 4, GFR 15-29 ml/min (HCC) 03/09/2020  ? 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 ?PCP:  Sallee Lange, NP ?Pharmacy:   ?[id number] [place] [address]?Phone: [phone]: [phone]? ? ? ? ?Social Determinants of Health (SDOH) Interventions ?  ? ?Readmission Risk Interventions ?Readmission Risk Prevention Plan 10/19/2021  ?Transportation Screening Complete  ?PCP or Specialist Appt within 5-7 Days Complete  ?Home Care Screening Complete  ?Medication Review (RN CM) Complete  ?Some recent data might be hidden  ? ? ? ?

## 2021-10-19 NOTE — Progress Notes (Signed)
? ?   Attending Progress Note ? ?History: Aimee Oliver is s/p bilateral burr holes for SDH. ? ?POD2: complains of lack of sleep overnight due to cardiac monitor.  ? ?POD1: NAEO. Reports improvement since surgery. Denying pain.  ? ?Physical Exam: ?Vitals:  ? 10/19/21 0629 10/19/21 0738  ?BP: 125/79 126/67  ?Pulse: 74 71  ?Resp: 18 18  ?Temp: 97.7 ?F (36.5 ?C) 97.9 ?F (36.6 ?C)  ?SpO2: 97% 99%  ? ? ?AA Ox3 ?CNI ?MAEW ?No pronator drift ? ?Incisions c/d/I  ? ?Data: ? ?Recent Labs  ?Lab 10/16/21 ?2125 10/17/21 ?0415 10/18/21 ?8295  ?NA 132* 138 138  ?K 3.5 4.3 4.4  ?CL 102 109 111  ?CO2 20* 22 19*  ?BUN 33* 30* 22  ?CREATININE 1.56* 1.42* 1.29*  ?GLUCOSE 123* 104* 120*  ?CALCIUM 9.5 9.1 8.5*  ? ? ?Recent Labs  ?Lab 10/17/21 ?0415  ?AST 42*  ?ALT 27  ?ALKPHOS 49  ? ?  ? Recent Labs  ?Lab 10/16/21 ?2125 10/17/21 ?0415 10/18/21 ?6213  ?WBC 8.5 6.5 6.3  ?HGB 8.3* 7.5* 7.2*  ?HCT 26.1* 23.3* 22.5*  ?PLT 258 240 222  ? ? ?Recent Labs  ?Lab 10/16/21 ?2245  ?APTT 23*  ?INR 0.9  ? ?  ?   ? ? ?Other tests/results: none  ? ?Assessment/Plan: ? ?Aimee Oliver a 81 y.o presenting after a remote fall with balance and dizziness. 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 She underwent bilateral burr holes for evacuation on 3/12 with Dr. Lacinda Axon ? ?- mobilize ?- pain control; avoid over sedation ?- pt neurologically stable. ?- remainder of care per admitting team.  ?- please call neurosurgery with any questions or concerns.  ? ?Cooper Render PA-C ?Department of Neurosurgery ? ?  ?

## 2021-10-19 NOTE — Care Management Important Message (Signed)
Important Message ? ?Patient Details  ?Name: Aimee Oliver ?MRN: [id number] ?Date of Birth: Feb 06, 1941 ? ? ?Medicare Important Message Given:  N/A - LOS <3 / Initial given by admissions ? ? ? ? ?[name] ?10/19/2021, 7:42 AM ?

## 2021-10-19 NOTE — Progress Notes (Signed)
Physical Therapy Treatment ?Patient Details ?Name: Aimee Oliver ?MRN: [id number] ?DOB: September 03, 1940 ?Today's Date: 10/19/2021 ? ? ?History of Present Illness Pt is an 81 y.o. female who presents to the ED on 3/12 due to confusion and hallucination. Per patient and partner, she fell out of bed around February 27 and has noticed a decline in mental status since. Admitted for bilateral subdural hematoma, subarachnoid hemorrhage, and bilateral burr hole craniectomy w/ subdural evacuation. PmHx: HTN, HLD, GERD, CKD, Hx of Acoustic Scwannoma w/ Chronic Vertigo. ? ?  ?PT Comments  ? ? Pt sleeping in bed upon PT entrance into room for evaluation today; wakes to tactile and verbal cue. Pt is A&Ox4 and denies any c/o pain at rest. Pt is able to perform all bed mobility w/ SUPERVISION to sit EOB. Once seated EOB she is able to progress to standing w/ CGA using RW and is able to ambulate ~127f w/ same level of assistance. Pt did not have any c/o negative symptoms throughout session. Pt will benefit from continued skilled PT in order to increase LE strength, improve balance/mobility/gait, and restore PLOF. Current discharge recommendation remains appropriate due to the level of assistance required by the patient to ensure safety and improve overall function.  ?  ?Recommendations for follow up therapy are one component of a multi-disciplinary discharge planning process, led by the attending physician.  Recommendations may be updated based on patient status, additional functional criteria and insurance authorization. ? ?Follow Up Recommendations ? Home health PT ?  ?  ?Assistance Recommended at Discharge Intermittent Supervision/Assistance  ?Patient can return home with the following Assistance with cooking/housework;A little help with bathing/dressing/bathroom;A little help with walking and/or transfers;Assist for transportation;Help with stairs or ramp for entrance ?  ?Equipment Recommendations ?    ?  ?Recommendations for Other  Services   ? ? ?  ?Precautions / Restrictions Precautions ?Precautions: Fall ?Restrictions ?Weight Bearing Restrictions: No  ?  ? ?Mobility ? Bed Mobility ?Overal bed mobility: Needs Assistance ?Bed Mobility: Sit to Supine, Supine to Sit ?  ?  ?Supine to sit: Supervision ?Sit to supine: Supervision ?  ?  ?  ? ?Transfers ?Overall transfer level: Needs assistance ?Equipment used: Rolling walker (2 wheels) ?Transfers: Sit to/from Stand ?Sit to Stand: Min guard ?  ?  ?  ?  ?  ?  ?  ? ?Ambulation/Gait ?Ambulation/Gait assistance: Min guard ?Gait Distance (Feet): 160 Feet ?Assistive device: Rolling walker (2 wheels) ?Gait Pattern/deviations: Decreased step length - right, Decreased step length - left, Step-through pattern, Decreased stride length ?Gait velocity: decreased ?  ?  ?  ? ? ?Stairs ?  ?  ?  ?  ?  ? ? ?Wheelchair Mobility ?  ? ?Modified Rankin (Stroke Patients Only) ?  ? ? ?  ?Balance Overall balance assessment: Needs assistance ?Sitting-balance support: Feet supported, No upper extremity supported, Bilateral upper extremity supported ?Sitting balance-Leahy Scale: Good ?  ?  ?Standing balance support: Bilateral upper extremity supported, During functional activity, Reliant on assistive device for balance ?Standing balance-Leahy Scale: Fair ?  ?  ?  ?  ?  ?  ?  ?  ?  ?  ?  ?  ?  ? ?  ?Cognition Arousal/Alertness: Awake/alert ?[facility] assessed/performed ?Overall Cognitive Status: Within Functional Limits for tasks assessed ?  ?  ?  ?  ?  ?  ?  ?  ?  ?  ?  ?  ?  ?  ?  ?  ?  ?  ?  ? ?  ?  Exercises   ? ?  ?General Comments   ?  ?  ? ?Pertinent Vitals/Pain Pain Assessment ?Pain Assessment: No/denies pain  ? ? ?Home Living   ?  ?  ?  ?  ?  ?  ?  ?  ?  ?   ?  ?Prior Function    ?  ?  ?   ? ?PT Goals (current goals can now be found in the care plan section) Progress towards PT goals: Progressing toward goals ? ?  ?Frequency ? ? ? Min 2X/week ? ? ? ?  ?PT Plan Current plan remains appropriate   ? ? ?Co-evaluation   ?  ?  ?  ?  ? ?  ?AM-PAC PT "6 Clicks" Mobility   ?Outcome Measure ? Help needed turning from your back to your side while in a flat bed without using bedrails?: A Little ?Help needed moving from lying on your back to sitting on the side of a flat bed without using bedrails?: A Little ?Help needed moving to and from a bed to a chair (including a wheelchair)?: A Little ?Help needed standing up from a chair using your arms (e.g., wheelchair or bedside chair)?: A Little ?Help needed to walk in hospital room?: A Little ?Help needed climbing 3-5 steps with a railing? : A Little ?6 Click Score: 18 ? ?  ?End of Session Equipment Utilized During Treatment: Gait belt ?Activity Tolerance: Patient tolerated treatment well ?Patient left: in bed;with call bell/phone within reach;with bed alarm set ?Nurse Communication: Mobility status ?PT Visit Diagnosis: Unsteadiness on feet (R26.81);Muscle weakness (generalized) (M62.81);History of falling (Z91.81) ?  ? ? ?Time: 1031-1050 ?PT Time Calculation (min) (ACUTE ONLY): 19 min ? ?Charges:             ?          ? ? ?Jonnie Kind, SPT ?10/19/2021, 11:31 AM ? ?

## 2021-10-19 NOTE — Discharge Instructions (Signed)
NEUROSURGERY DISCHARGE INSTRUCTIONS ? ?Admission diagnosis: Subdural hemorrhage ([place]) [I62.00] ?Subdural hematoma [S06.5XAA] ?Subarachnoid bleed ([place]) [I60.9] ?Altered mental status, unspecified altered mental status type [R41.82] ? ?Operative procedure: bilateral burr holes for SDH ? ?What to do after you leave the hospital: ? ?Recommended diet: regular diet. Increase protein intake to promote wound healing. ? ?Recommended activity: no lifting, driving, or strenuous exercise for 4 weeks . You should walk multiple times per day ? ?Special Instructions ? ?No straining, no heavy lifting > 10lbs x 4 weeks.  Keep incision area clean and dry. May shower in 2 days. No baths or pools for 6 weeks.  ?Please remove dressing tomorrow, no need to apply a bandage afterwards ? ?You have sutures or staples that will be removed in clinic. ? ?Please take pain medications as directed. Take a stool softener if on pain medications ? ? ?Please Report any of the following: ?Nausea or Vomiting, Temperature is greater than 101.16F (38.1C) degrees, Dizziness, Abdominal Pain, Difficulty Breathing or Shortness of Breath, Inability to Eat, drink Fluids, or Take medications, Bleeding, swelling, or drainage from surgical incision sites, New numbness or weakness, and Bowel or bladder dysfunction to the neurosurgeon on call at [phone] ? ?Additional Follow up appointments ?Please follow up with Dr Lacinda Axon in [place] clinic as scheduled in 2-3 weeks ? ? ?Please see below for scheduled appointments: ? ?No future appointments. ? ?  ?

## 2021-11-08 ENCOUNTER — Other Ambulatory Visit: Payer: Self-pay | Admitting: Unknown Physician Specialty

## 2021-11-08 ENCOUNTER — Other Ambulatory Visit (HOSPITAL_COMMUNITY): Payer: Self-pay | Admitting: Unknown Physician Specialty

## 2021-11-08 DIAGNOSIS — S065XAA Traumatic subdural hemorrhage with loss of consciousness status unknown, initial encounter: Secondary | ICD-10-CM

## 2021-12-07 ENCOUNTER — Ambulatory Visit
Admission: RE | Admit: 2021-12-07 | Discharge: 2021-12-07 | Disposition: A | Discharge status: Home / Self Care | Payer: Medicare HMO | Source: Ambulatory Visit | Attending: Unknown Physician Specialty | Admitting: Unknown Physician Specialty

## 2021-12-07 DIAGNOSIS — S065XAA Traumatic subdural hemorrhage with loss of consciousness status unknown, initial encounter: Secondary | ICD-10-CM | POA: Insufficient documentation

## 2022-09-16 ENCOUNTER — Encounter: Payer: Self-pay | Admitting: Ophthalmology

## 2022-09-19 NOTE — Discharge Instructions (Signed)

## 2022-09-21 ENCOUNTER — Encounter: Payer: Self-pay | Admitting: Ophthalmology

## 2022-09-21 ENCOUNTER — Encounter
Admission: RE | Disposition: A | Discharge status: Home / Self Care | Payer: Self-pay | Source: Home / Self Care | Attending: Ophthalmology

## 2022-09-21 ENCOUNTER — Ambulatory Visit
Admission: RE | Admit: 2022-09-21 | Discharge: 2022-09-21 | Disposition: A | Discharge status: Home / Self Care | Payer: Medicare HMO | Attending: Ophthalmology | Admitting: Ophthalmology

## 2022-09-21 ENCOUNTER — Ambulatory Visit: Payer: Medicare HMO | Admitting: Anesthesiology

## 2022-09-21 ENCOUNTER — Other Ambulatory Visit: Payer: Self-pay

## 2022-09-21 DIAGNOSIS — E7800 Pure hypercholesterolemia, unspecified: Secondary | ICD-10-CM | POA: Insufficient documentation

## 2022-09-21 DIAGNOSIS — I10 Essential (primary) hypertension: Secondary | ICD-10-CM | POA: Diagnosis not present

## 2022-09-21 DIAGNOSIS — H2511 Age-related nuclear cataract, right eye: Secondary | ICD-10-CM | POA: Insufficient documentation

## 2022-09-21 DIAGNOSIS — D649 Anemia, unspecified: Secondary | ICD-10-CM | POA: Diagnosis not present

## 2022-09-21 DIAGNOSIS — K219 Gastro-esophageal reflux disease without esophagitis: Secondary | ICD-10-CM | POA: Diagnosis not present

## 2022-09-21 DIAGNOSIS — R519 Headache, unspecified: Secondary | ICD-10-CM | POA: Diagnosis not present

## 2022-09-21 HISTORY — PX: CATARACT EXTRACTION W/PHACO: SHX586

## 2022-09-21 SURGERY — PHACOEMULSIFICATION, CATARACT, WITH IOL INSERTION
Anesthesia: Monitor Anesthesia Care | Site: Eye | Laterality: Right

## 2022-09-21 MED ORDER — SIGHTPATH DOSE#1 NA HYALUR & NA CHOND-NA HYALUR IO KIT
PACK | INTRAOCULAR | Status: DC | PRN
  Administered 2022-09-21: 1 via OPHTHALMIC

## 2022-09-21 MED ORDER — SIGHTPATH DOSE#1 BSS IO SOLN
INTRAOCULAR | Status: DC | PRN
  Administered 2022-09-21: 15 mL

## 2022-09-21 MED ORDER — FENTANYL CITRATE (PF) 100 MCG/2ML IJ SOLN
INTRAMUSCULAR | Status: DC | PRN
  Administered 2022-09-21: 25 ug via INTRAVENOUS

## 2022-09-21 MED ORDER — SIGHTPATH DOSE#1 BSS IO SOLN
INTRAOCULAR | Status: DC | PRN
  Administered 2022-09-21: 90 mL via OPHTHALMIC

## 2022-09-21 MED ORDER — BRIMONIDINE TARTRATE-TIMOLOL 0.2-0.5 % OP SOLN
OPHTHALMIC | Status: DC | PRN
  Administered 2022-09-21: 1 [drp] via OPHTHALMIC

## 2022-09-21 MED ORDER — MIDAZOLAM HCL 2 MG/2ML IJ SOLN
INTRAMUSCULAR | Status: DC | PRN
  Administered 2022-09-21 (×2): 1 mg via INTRAVENOUS

## 2022-09-21 MED ORDER — ARMC OPHTHALMIC DILATING DROPS
1.0000 | OPHTHALMIC | Status: DC | PRN
  Administered 2022-09-21 (×3): 1 via OPHTHALMIC

## 2022-09-21 MED ORDER — SIGHTPATH DOSE#1 BSS IO SOLN
INTRAOCULAR | Status: DC | PRN
  Administered 2022-09-21: 1 mL via INTRAMUSCULAR

## 2022-09-21 MED ORDER — TETRACAINE HCL 0.5 % OP SOLN
1.0000 [drp] | OPHTHALMIC | Status: DC | PRN
  Administered 2022-09-21 (×3): 1 [drp] via OPHTHALMIC

## 2022-09-21 MED ORDER — LACTATED RINGERS IV SOLN: INTRAVENOUS | Status: DC

## 2022-09-21 MED ORDER — CEFUROXIME OPHTHALMIC INJECTION 1 MG/0.1 ML
INJECTION | OPHTHALMIC | Status: DC | PRN
  Administered 2022-09-21: .1 mL via INTRACAMERAL

## 2022-09-21 SURGICAL SUPPLY — 10 items
CATARACT SUITE SIGHTPATH (MISCELLANEOUS) ×1 IMPLANT
FEE CATARACT SUITE SIGHTPATH (MISCELLANEOUS) ×1 IMPLANT
GLOVE SRG 8 PF TXTR STRL LF DI (GLOVE) ×1 IMPLANT
GLOVE SURG ENC TEXT LTX SZ7.5 (GLOVE) ×1 IMPLANT
GLOVE SURG UNDER POLY LF SZ8 (GLOVE) ×1
LENS IOL TECNIS EYHANCE 20.0 (Intraocular Lens) IMPLANT
NDL FILTER BLUNT 18X1 1/2 (NEEDLE) ×1 IMPLANT
NEEDLE FILTER BLUNT 18X1 1/2 (NEEDLE) ×1 IMPLANT
SYR 3ML LL SCALE MARK (SYRINGE) ×1 IMPLANT
WATER STERILE IRR 250ML POUR (IV SOLUTION) ×1 IMPLANT

## 2022-09-21 NOTE — Anesthesia Preprocedure Evaluation (Signed)
Anesthesia Evaluation  Patient identified by MRN, date of birth, ID band Patient awake    Reviewed: Allergy & Precautions, H&P , NPO status , Patient's Chart, lab work & pertinent test results, reviewed documented beta blocker date and time   History of Anesthesia Complications (+) PONV and history of anesthetic complications  Airway Mallampati: II  TM Distance: >3 FB Neck ROM: full    Dental  (+) Teeth Intact   Pulmonary neg pulmonary ROS   Pulmonary exam normal        Cardiovascular Exercise Tolerance: Poor hypertension, On Medications (-) angina (-) Past MI and (-) Cardiac Stents Normal cardiovascular exam(-) dysrhythmias (-) Valvular Problems/Murmurs Rhythm:regular Rate:Normal     Neuro/Psych  Headaches, neg Seizures Burr holes after brain bleed  Neuromuscular disease  negative psych ROS   GI/Hepatic Neg liver ROS,GERD  Medicated,,  Endo/Other  negative endocrine ROS    Renal/GU Renal disease  negative genitourinary   Musculoskeletal   Abdominal   Peds  Hematology  (+) Blood dyscrasia, anemia   Anesthesia Other Findings Past Medical History: No date: Acoustic neuroma (HCC) No date: Anemia     Comment:  boarderline anemic No date: Arthritis No date: Headache     Comment:  sinus head aches No date: Hypercholesterolemia No date: Hypertension No date: PONV (postoperative nausea and vomiting)     Comment:  with hysterectomy Past Surgical History: No date: ABDOMINAL HYSTERECTOMY No date: CATARACT EXTRACTION 2015: FRACTURE SURGERY; Left     Comment:  wrist 1956: FRACTURE SURGERY; Left     Comment:  ankle 01/25/2017: IR FLUORO GUIDED NEEDLE PLC ASPIRATION/INJECTION LOC No date: PLACEMENT OF BREAST IMPLANTS 08/13/2010: Right total knee arthroplasty     Comment:  Dr. Marry Guan 01/27/2017: TOTAL HIP ARTHROPLASTY; Right     Comment:  Procedure: TOTAL HIP ARTHROPLASTY;  Surgeon: Dereck Leep, MD;   Location: [facility] ORS;  Service: Orthopedics;               Laterality: Right; BMI    Body Mass Index: 23.18 kg/m     Reproductive/Obstetrics negative OB ROS                             Anesthesia Physical Anesthesia Plan  ASA: 3  Anesthesia Plan: MAC   Post-op Pain Management:    Induction: Intravenous  PONV Risk Score and Plan: 4 or greater and Midazolam and Treatment may vary due to age or medical condition  Airway Management Planned: Natural Airway and Nasal Cannula  Additional Equipment:   Intra-op Plan:   Post-operative Plan:   Informed Consent: I have reviewed the patients History and Physical, chart, labs and discussed the procedure including the risks, benefits and alternatives for the proposed anesthesia with the patient or authorized representative who has indicated his/her understanding and acceptance.     Dental Advisory Given  Plan Discussed with: CRNA  Anesthesia Plan Comments:         Anesthesia Quick Evaluation

## 2022-09-21 NOTE — Transfer of Care (Signed)
Immediate Anesthesia Transfer of Care Note  Patient: Aimee Oliver  Procedure(s) Performed: CATARACT EXTRACTION PHACO AND INTRAOCULAR LENS PLACEMENT (IOC) RIGHT  10.59  01:13.9 (Right: Eye)  Patient Location: PACU  Anesthesia Type: MAC  Level of Consciousness: awake, alert  and patient cooperative  Airway and Oxygen Therapy: Patient Spontanous Breathing and Patient connected to supplemental oxygen  Post-op Assessment: Post-op Vital signs reviewed, Patient's Cardiovascular Status Stable, Respiratory Function Stable, Patent Airway and No signs of Nausea or vomiting  Post-op Vital Signs: Reviewed and stable  Complications: No notable events documented.

## 2022-09-21 NOTE — H&P (Signed)
.[place]   Primary Care Physician:  Sallee Lange, NP Ophthalmologist: Dr. Leandrew Koyanagi  Pre-Procedure History & Physical: HPI:  Aimee Oliver is a 82 y.o. female here for ophthalmic surgery.   Past Medical History:  Diagnosis Date   Acoustic neuroma ([place])    Anemia    boarderline anemic   Arthritis    Headache    sinus head aches   Hypercholesterolemia    Hypertension    PONV (postoperative nausea and vomiting)    with hysterectomy    Past Surgical History:  Procedure Laterality Date   ABDOMINAL HYSTERECTOMY     BURR HOLE Bilateral 10/17/2021   Procedure: Haskell Flirt;  Surgeon: Deetta Perla, MD;  Location: [facility] ORS;  Service: Neurosurgery;  Laterality: Bilateral;   CATARACT EXTRACTION     FRACTURE SURGERY Left 2015   wrist   FRACTURE SURGERY Left 1956   ankle   IR FLUORO GUIDED NEEDLE PLC ASPIRATION/INJECTION LOC  01/25/2017   PLACEMENT OF BREAST IMPLANTS     Right total knee arthroplasty  08/13/2010   Dr. Marry Guan   TOTAL HIP ARTHROPLASTY Right 01/27/2017   Procedure: TOTAL HIP ARTHROPLASTY;  Surgeon: Dereck Leep, MD;  Location: [facility] ORS;  Service: Orthopedics;  Laterality: Right;    Prior to Admission medications   Medication Sig Start Date End Date Taking? Authorizing Provider  Calcium Carbonate-Vit D-Min (CALTRATE 600+D PLUS PO) Take 1 tablet by mouth daily.   Yes [provider]  Cranberry-Vitamin C-Probiotic (AZO CRANBERRY PO) Take 1 tablet by mouth daily as needed (for UTI prevention).   Yes [provider]  fluticasone (FLONASE) 50 MCG/ACT nasal spray Place 2 sprays into both nostrils 2 (two) times daily. 10/05/21  Yes [provider]  loratadine (CLARITIN) 10 MG tablet Take 10 mg by mouth daily. 10/05/21  Yes [provider]  meclizine (ANTIVERT) 12.5 MG tablet Take 12.5 mg by mouth 3 (three) times daily as needed. 10/05/21  Yes [provider]  metoprolol succinate (TOPROL-XL) 50 MG 24 hr  tablet Take 25 mg by mouth at bedtime. 12/06/16  Yes [provider]  omeprazole (PRILOSEC) 20 MG capsule Take 20 mg by mouth daily before breakfast. 12/06/16  Yes [provider]  [place] Take 1 tablet by mouth 2 (two) times daily. PHYSIO OMEGA (a blend of DPA, EPA, & DHA FATTY ACIDS)   Yes [provider]  OVER THE COUNTER MEDICATION 1 tablet at bedtime. Applied nutrition Anti-Aging total body daily defense, Co Q10, Vitamin D3, Resveratrol, ginkgo, green tea, lutein and Omega-3 oils   Yes [provider]  oxybutynin (DITROPAN) 5 MG tablet Take 5 mg by mouth 3 (three) times daily. 06/12/21  Yes [provider]  acetaminophen (TYLENOL) 650 MG CR tablet Take 1,300 mg by mouth 2 (two) times daily.    [provider]  oxyCODONE (OXY IR/ROXICODONE) 5 MG immediate release tablet Take 0.5 tablets (2.5 mg total) by mouth every 6 (six) hours as needed for moderate pain. Patient not taking: Reported on 09/16/2022 10/19/21   Sharen Hones, MD  rosuvastatin (CRESTOR) 40 MG tablet Take 40 mg by mouth at bedtime.    [provider]    Allergies as of 08/31/2022   (No Known Allergies)    Family History  Problem Relation Age of Onset   Stomach cancer Mother    Leukemia Father     Social History   Socioeconomic History   Marital status: Divorced  Spouse name: Not on file   Number of children: Not on file   Years of education: Not on file   Highest education level: Not on file  Occupational History   Not on file  Tobacco Use   Smoking status: Never   Smokeless tobacco: Never  Vaping Use   Vaping Use: Never used  Substance and Sexual Activity   Alcohol use: Yes    Comment: 1 cocktail every night   Drug use: No   Sexual activity: Not on file  Other Topics Concern   Not on file  Social History Narrative   Not on file   Social Determinants of Health   Financial Resource Strain: Not on file  Food Insecurity: Not on  file  Transportation Needs: Not on file  Physical Activity: Not on file  Stress: Not on file  Social Connections: Not on file  Intimate Partner Violence: Not on file    Review of Systems: See HPI, otherwise negative ROS  Physical Exam: BP (!) 171/88   Pulse 63   Temp (!) 97 F (36.1 C) (Temporal)   Resp 18   Ht 5' 4"$  (1.626 m)   Wt 60.8 kg   SpO2 99%   BMI 23.00 kg/m  General:   Alert,  pleasant and cooperative in NAD Head:  Normocephalic and atraumatic. Lungs:  Clear to auscultation.    Heart:  Regular rate and rhythm.   Impression/Plan: Aimee Oliver is here for ophthalmic surgery.  Risks, benefits, limitations, and alternatives regarding ophthalmic surgery have been reviewed with the patient.  Questions have been answered.  All parties agreeable.   Leandrew Koyanagi, MD  09/21/2022, 7:58 AM

## 2022-09-21 NOTE — Op Note (Signed)
  LOCATION:  [place]   PREOPERATIVE DIAGNOSIS:    Nuclear sclerotic cataract right eye. H25.11   POSTOPERATIVE DIAGNOSIS:  Nuclear sclerotic cataract right eye.     PROCEDURE:  Phacoemusification with posterior chamber intraocular lens placement of the right eye   ULTRASOUND TIME: Procedure(s): CATARACT EXTRACTION PHACO AND INTRAOCULAR LENS PLACEMENT (IOC) RIGHT  10.59  01:13.9 (Right)  LENS:   Implant Name Type Inv. Item Serial No. Manufacturer Lot No. LRB No. Used Action  LENS IOL TECNIS EYHANCE 20.0 - [id number] Intraocular Lens LENS IOL TECNIS EYHANCE 20.0 [id number] [employer]  Right 1 Implanted         SURGEON:  Wyonia Hough, MD   ANESTHESIA:  Topical with tetracaine drops and 2% Xylocaine jelly, augmented with 1% preservative-free intracameral lidocaine.    COMPLICATIONS:  None.   DESCRIPTION OF PROCEDURE:  The patient was identified in the holding room and transported to the operating room and placed in the supine position under the operating microscope.  The right eye was identified as the operative eye and it was prepped and draped in the usual sterile ophthalmic fashion.   A 1 millimeter clear-corneal paracentesis was made at the 12:00 position.  0.5 ml of preservative-free 1% lidocaine was injected into the anterior chamber. The anterior chamber was filled with Viscoat viscoelastic.  A 2.4 millimeter keratome was used to make a near-clear corneal incision at the 9:00 position.  A curvilinear capsulorrhexis was made with a cystotome and capsulorrhexis forceps.  Balanced salt solution was used to hydrodissect and hydrodelineate the nucleus.   Phacoemulsification was then used in stop and chop fashion to remove the lens nucleus and epinucleus.  The remaining cortex was then removed using the irrigation and aspiration handpiece. Provisc was then placed into the capsular bag to distend it for lens placement.  A lens was then injected into the capsular bag.   The remaining viscoelastic was aspirated.   Wounds were hydrated with balanced salt solution.  The anterior chamber was inflated to a physiologic pressure with balanced salt solution.  No wound leaks were noted. Cefuroxime 0.1 ml of a '10mg'$ /ml solution was injected into the anterior chamber for a dose of 1 mg of intracameral antibiotic at the completion of the case.   Timolol and Brimonidine drops were applied to the eye.  The patient was taken to the recovery room in stable condition without complications of anesthesia or surgery.   Denissa Cozart 09/21/2022, 8:49 AM

## 2022-09-21 NOTE — Anesthesia Postprocedure Evaluation (Signed)
Anesthesia Post Note  Patient: TRUDY-ANN MAHN  Procedure(s) Performed: CATARACT EXTRACTION PHACO AND INTRAOCULAR LENS PLACEMENT (IOC) RIGHT  10.59  01:13.9 (Right: Eye)  Patient location during evaluation: PACU Anesthesia Type: MAC Level of consciousness: awake and alert Pain management: pain level controlled Vital Signs Assessment: post-procedure vital signs reviewed and stable Respiratory status: spontaneous breathing, nonlabored ventilation, respiratory function stable and patient connected to nasal cannula oxygen Cardiovascular status: stable and blood pressure returned to baseline Postop Assessment: no apparent nausea or vomiting Anesthetic complications: no   No notable events documented.   Last Vitals:  Vitals:   09/21/22 0849 09/21/22 0854  BP: (!) 153/69 (!) 156/71  Pulse: (!) 58 60  Resp: 14 15  Temp: (!) 36.1 C (!) 36.1 C  SpO2: 98% 97%    Last Pain:  Vitals:   09/21/22 0854  TempSrc:   PainSc: 0-No pain                 Martha Clan

## 2022-09-22 ENCOUNTER — Encounter: Payer: Self-pay | Admitting: Ophthalmology

## 2022-12-16 ENCOUNTER — Encounter: Payer: Self-pay | Admitting: Otolaryngology

## 2022-12-16 DIAGNOSIS — R42 Dizziness and giddiness: Secondary | ICD-10-CM

## 2022-12-19 ENCOUNTER — Other Ambulatory Visit: Payer: Self-pay | Admitting: Otolaryngology

## 2022-12-19 DIAGNOSIS — R42 Dizziness and giddiness: Secondary | ICD-10-CM

## 2022-12-19 DIAGNOSIS — H933X2 Disorders of left acoustic nerve: Secondary | ICD-10-CM

## 2022-12-28 ENCOUNTER — Encounter: Payer: Self-pay | Admitting: Otolaryngology

## 2023-01-09 ENCOUNTER — Ambulatory Visit
Admission: RE | Admit: 2023-01-09 | Discharge: 2023-01-09 | Disposition: A | Discharge status: Home / Self Care | Payer: Medicare HMO | Source: Ambulatory Visit | Attending: Otolaryngology | Admitting: Otolaryngology

## 2023-01-09 DIAGNOSIS — R42 Dizziness and giddiness: Secondary | ICD-10-CM

## 2023-01-09 DIAGNOSIS — H933X2 Disorders of left acoustic nerve: Secondary | ICD-10-CM

## 2023-01-09 MED ORDER — GADOPICLENOL 0.5 MMOL/ML IV SOLN
6.0000 mL | Freq: Once | INTRAVENOUS | Status: AC | PRN
  Administered 2023-01-09: 6 mL via INTRAVENOUS

## 2023-12-18 IMAGING — CT CT HEAD W/O CM
1 series · 15 of 30 positions shown, 19 images · non-contrast
Comparison: CT head: October 18, 2021.

CLINICAL DATA: Follow-up subdural.



[Series 2: head wo · axial · 0.44mm/px · z∈[-122,+13]mm · 15 of 30 slices shown, 19 images]
[im 2/30  brain]
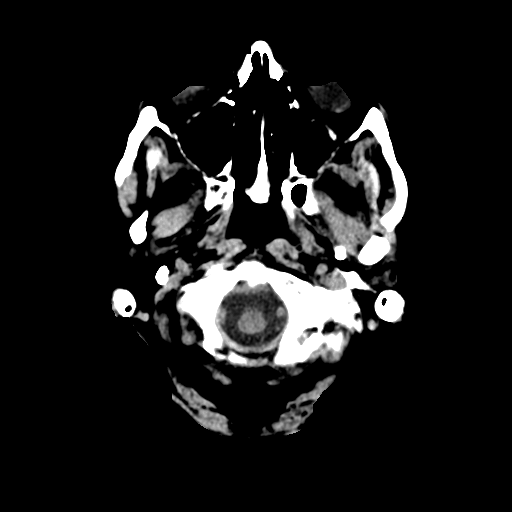
[im 2/30  bone]
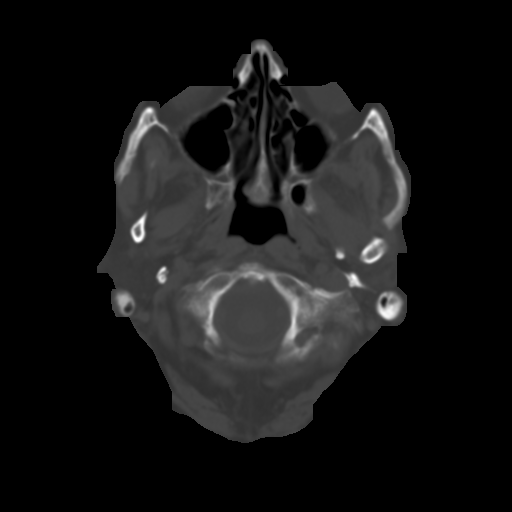
[im 4/30  brain]
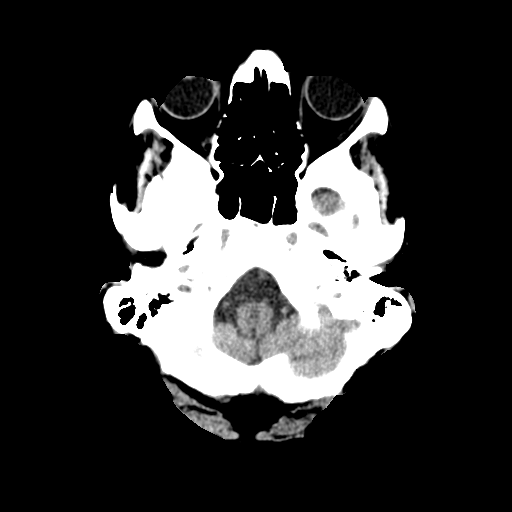
[im 6/30  brain]
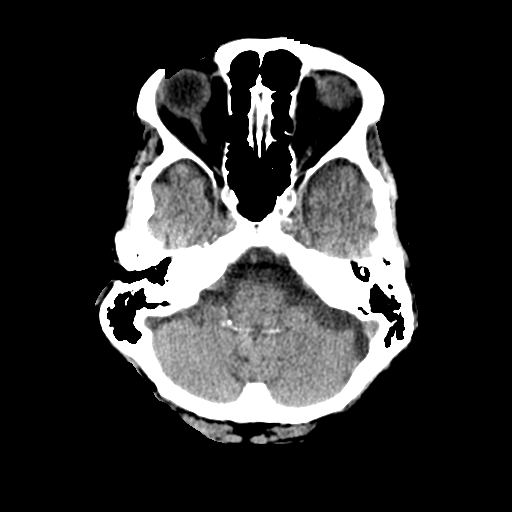
[im 8/30  brain]
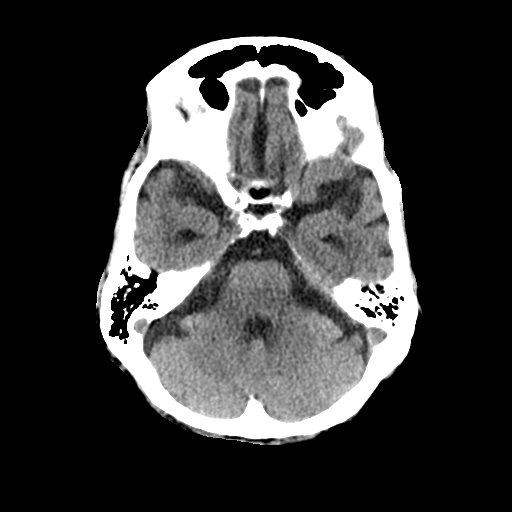
[im 10/30  brain]
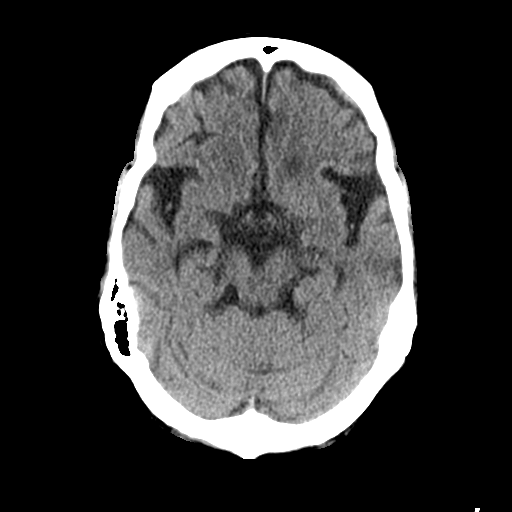
[im 10/30  bone]
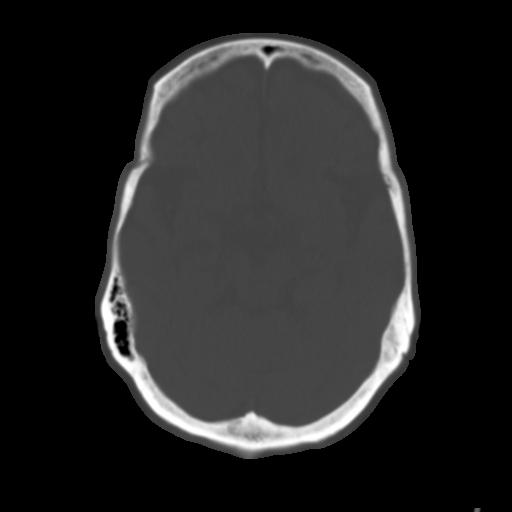
[im 12/30  brain]
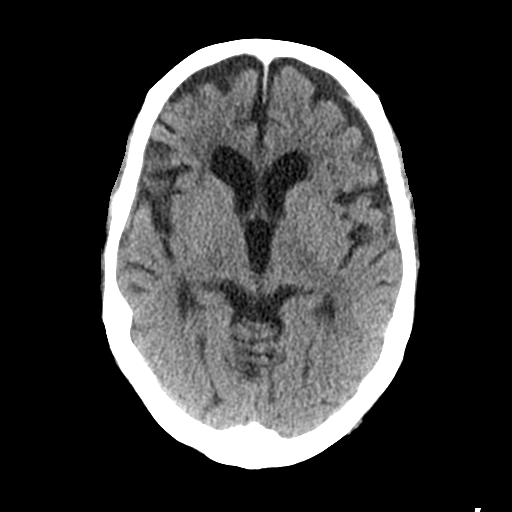
[im 14/30  brain]
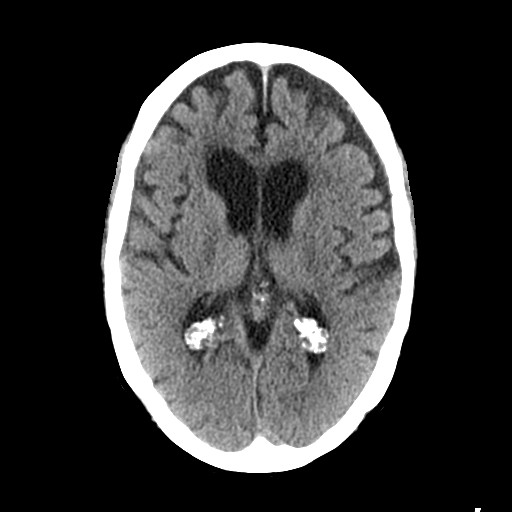
[im 16/30  brain]
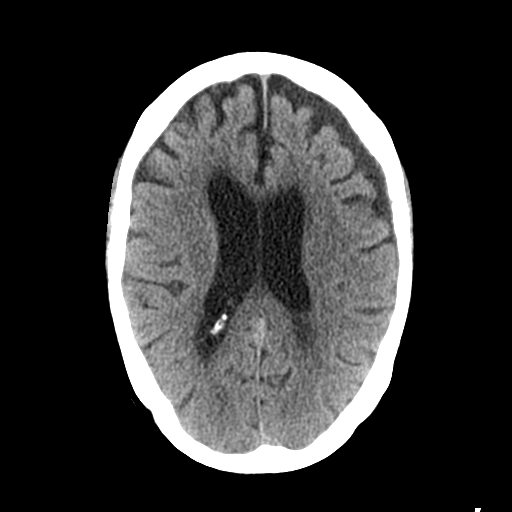
[im 17/30  brain]
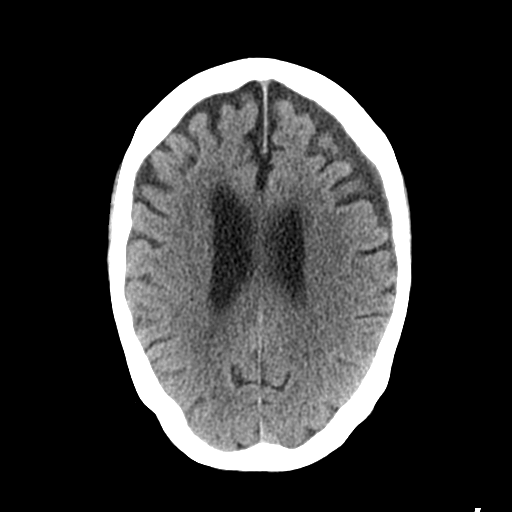
[im 17/30  bone]
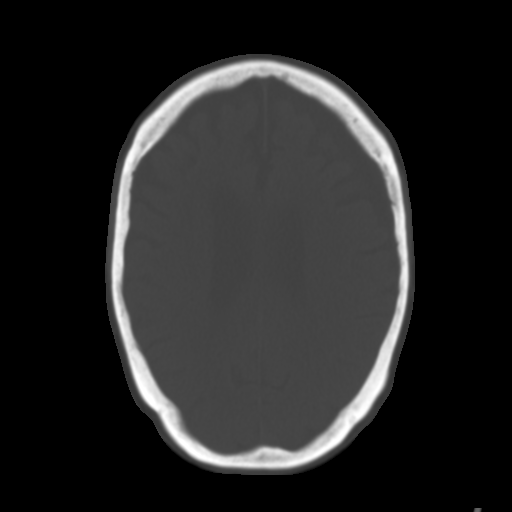
[im 19/30  brain]
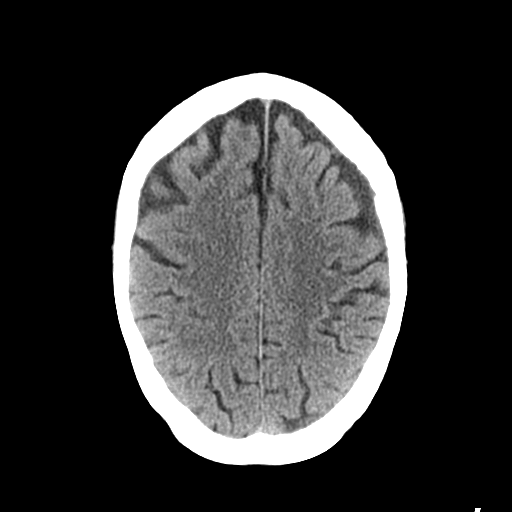
[im 21/30  brain]
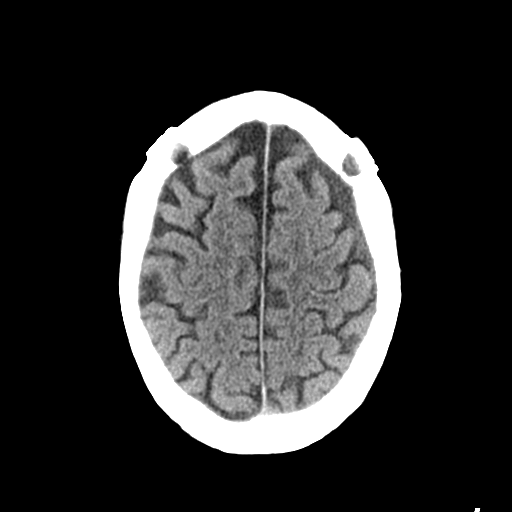
[im 23/30  brain]
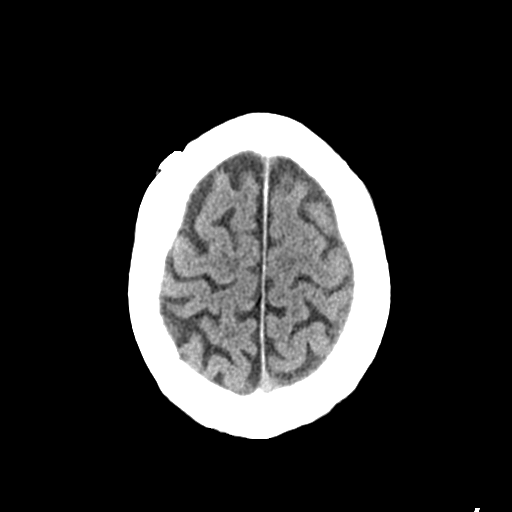
[im 25/30  brain]
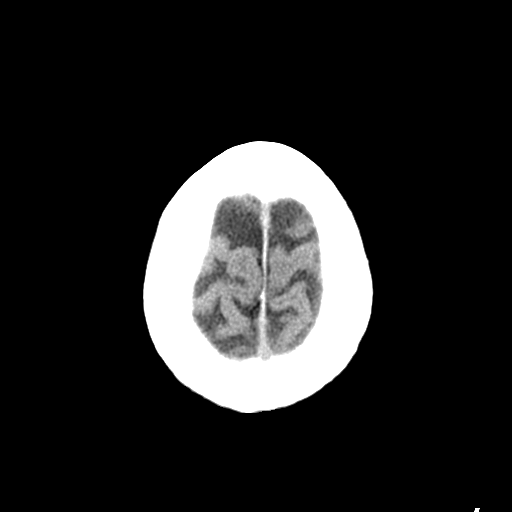
[im 25/30  bone]
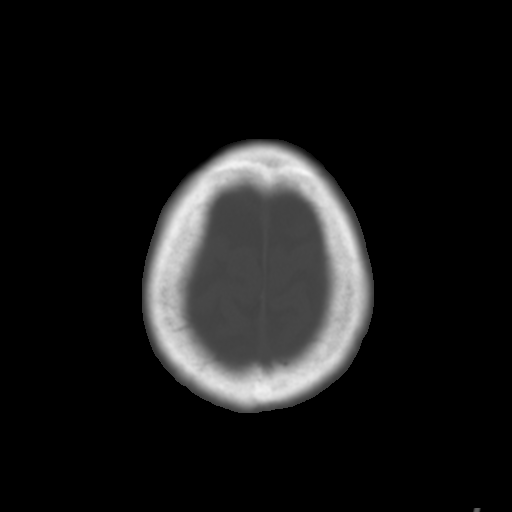
[im 27/30  brain]
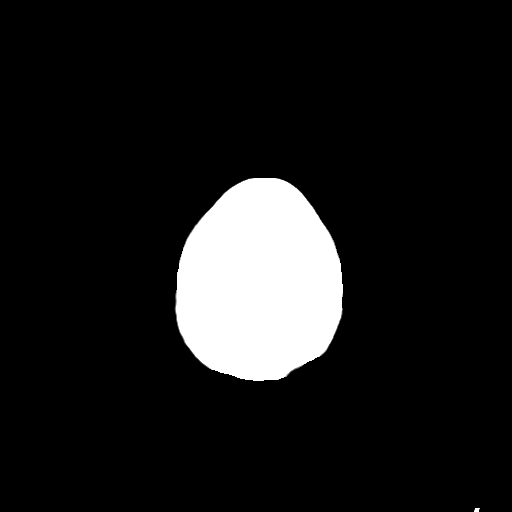
[im 29/30  brain]
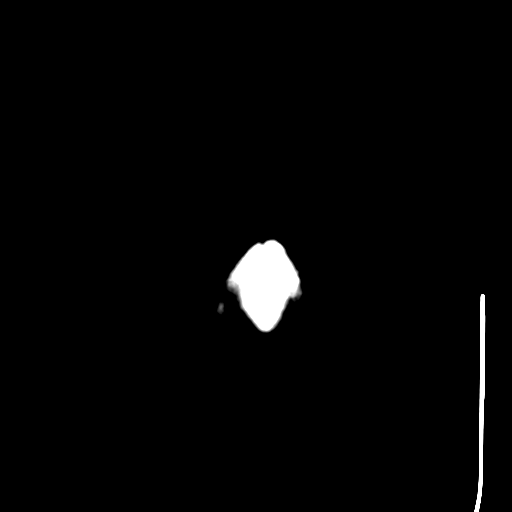

[15 of 30 positions shown; findings below may reference images not displayed]

FINDINGS: Brain: Decreased size of bilateral subdural hematomas. Right sided
collection is not visible. Left-sided collection measures
approximately 5 mm in thickness (for example series 4, image 32) and
is largely hypodense with trace hyperdense blood products along the
left frontal convexity (for example, series 2, image 11). Improved
mass effect on bilateral frontal convexities without midline shift.
No hydrocephalus. Cerebral atrophy and ex vacuo ventricular
dilation. No evidence of acute large vascular territory infarct.

Vascular: No hyperdense vessel identified. Calcific intracranial
atherosclerosis.

Skull: Bifrontal burr holes from prior subdural evacuation. No acute
fracture.

Sinuses/Orbits: Visualized sinuses are clear. No acute orbital
findings.

Other: No mastoid effusions.
IMPRESSION: Decreased size of bilateral subdural hematomas, as described above.
Improved mass effect on the frontal convexities without midline
shift.
